# Patient Record
Sex: Female | Born: 1970 | Race: White | Hispanic: No | Marital: Married | Smoking: Never smoker
Health system: Southern US, Community
[De-identification: ages and names within clinical notes are randomized; demographics above are authoritative.]

## PROBLEM LIST (undated history)

## (undated) DIAGNOSIS — F419 Anxiety disorder, unspecified: Secondary | ICD-10-CM

## (undated) DIAGNOSIS — N6320 Unspecified lump in the left breast, unspecified quadrant: Secondary | ICD-10-CM

## (undated) DIAGNOSIS — F32A Depression, unspecified: Secondary | ICD-10-CM

## (undated) DIAGNOSIS — G4730 Sleep apnea, unspecified: Secondary | ICD-10-CM

## (undated) DIAGNOSIS — E785 Hyperlipidemia, unspecified: Secondary | ICD-10-CM

## (undated) HISTORY — PX: LASIK: SHX215

## (undated) HISTORY — DX: Anxiety disorder, unspecified: F41.9

## (undated) HISTORY — PX: COLONOSCOPY: SHX174

---

## 1998-10-11 ENCOUNTER — Other Ambulatory Visit: Admission: RE | Admit: 1998-10-11 | Discharge: 1998-10-11 | Payer: Self-pay | Admitting: Obstetrics and Gynecology

## 1999-04-27 ENCOUNTER — Emergency Department (HOSPITAL_COMMUNITY): Admission: EM | Admit: 1999-04-27 | Discharge: 1999-04-27 | Payer: Self-pay | Admitting: Emergency Medicine

## 1999-10-19 ENCOUNTER — Other Ambulatory Visit: Admission: RE | Admit: 1999-10-19 | Discharge: 1999-10-19 | Payer: Self-pay | Admitting: Obstetrics and Gynecology

## 2000-10-15 ENCOUNTER — Other Ambulatory Visit: Admission: RE | Admit: 2000-10-15 | Discharge: 2000-10-15 | Payer: Self-pay | Admitting: Obstetrics and Gynecology

## 2001-10-19 ENCOUNTER — Other Ambulatory Visit: Admission: RE | Admit: 2001-10-19 | Discharge: 2001-10-19 | Payer: Self-pay | Admitting: Obstetrics and Gynecology

## 2002-11-01 ENCOUNTER — Other Ambulatory Visit: Admission: RE | Admit: 2002-11-01 | Discharge: 2002-11-01 | Payer: Self-pay | Admitting: Obstetrics and Gynecology

## 2002-11-11 ENCOUNTER — Emergency Department (HOSPITAL_COMMUNITY): Admission: EM | Admit: 2002-11-11 | Discharge: 2002-11-12 | Payer: Self-pay | Admitting: Emergency Medicine

## 2002-11-12 ENCOUNTER — Encounter: Payer: Self-pay | Admitting: Emergency Medicine

## 2003-10-18 ENCOUNTER — Other Ambulatory Visit: Admission: RE | Admit: 2003-10-18 | Discharge: 2003-10-18 | Payer: Self-pay | Admitting: Obstetrics and Gynecology

## 2004-02-03 ENCOUNTER — Emergency Department (HOSPITAL_COMMUNITY): Admission: EM | Admit: 2004-02-03 | Discharge: 2004-02-03 | Payer: Self-pay | Admitting: Emergency Medicine

## 2004-10-29 ENCOUNTER — Other Ambulatory Visit: Admission: RE | Admit: 2004-10-29 | Discharge: 2004-10-29 | Payer: Self-pay | Admitting: Obstetrics and Gynecology

## 2004-11-06 ENCOUNTER — Ambulatory Visit (HOSPITAL_COMMUNITY): Admission: RE | Admit: 2004-11-06 | Discharge: 2004-11-06 | Payer: Self-pay | Admitting: Obstetrics and Gynecology

## 2004-11-22 ENCOUNTER — Encounter: Admission: RE | Admit: 2004-11-22 | Discharge: 2004-11-22 | Payer: Self-pay | Admitting: Obstetrics and Gynecology

## 2006-03-25 ENCOUNTER — Other Ambulatory Visit: Admission: RE | Admit: 2006-03-25 | Discharge: 2006-03-25 | Payer: Self-pay | Admitting: Obstetrics and Gynecology

## 2006-04-01 ENCOUNTER — Encounter: Admission: RE | Admit: 2006-04-01 | Discharge: 2006-04-01 | Payer: Self-pay | Admitting: Obstetrics and Gynecology

## 2006-06-04 ENCOUNTER — Ambulatory Visit (HOSPITAL_COMMUNITY): Admission: RE | Admit: 2006-06-04 | Discharge: 2006-06-04 | Payer: Self-pay | Admitting: Chiropractic Medicine

## 2007-01-28 ENCOUNTER — Other Ambulatory Visit: Admission: RE | Admit: 2007-01-28 | Discharge: 2007-01-28 | Payer: Self-pay | Admitting: Obstetrics and Gynecology

## 2007-02-26 ENCOUNTER — Ambulatory Visit (HOSPITAL_COMMUNITY): Admission: RE | Admit: 2007-02-26 | Discharge: 2007-02-26 | Payer: Self-pay | Admitting: Obstetrics and Gynecology

## 2007-06-28 ENCOUNTER — Inpatient Hospital Stay (HOSPITAL_COMMUNITY): Admission: AD | Admit: 2007-06-28 | Discharge: 2007-06-30 | Payer: Self-pay | Admitting: Obstetrics and Gynecology

## 2007-07-07 ENCOUNTER — Ambulatory Visit: Admission: RE | Admit: 2007-07-07 | Discharge: 2007-07-07 | Payer: Self-pay | Admitting: Obstetrics and Gynecology

## 2007-08-10 ENCOUNTER — Other Ambulatory Visit: Admission: RE | Admit: 2007-08-10 | Discharge: 2007-08-10 | Payer: Self-pay | Admitting: Obstetrics and Gynecology

## 2008-01-02 ENCOUNTER — Emergency Department (HOSPITAL_COMMUNITY): Admission: EM | Admit: 2008-01-02 | Discharge: 2008-01-02 | Payer: Self-pay | Admitting: Emergency Medicine

## 2008-07-15 ENCOUNTER — Encounter: Admission: RE | Admit: 2008-07-15 | Discharge: 2008-07-15 | Payer: Self-pay

## 2009-07-29 ENCOUNTER — Observation Stay (HOSPITAL_COMMUNITY): Admission: EM | Admit: 2009-07-29 | Discharge: 2009-07-29 | Payer: Self-pay | Admitting: Emergency Medicine

## 2010-11-03 ENCOUNTER — Encounter: Payer: Self-pay | Admitting: Obstetrics and Gynecology

## 2011-01-17 LAB — URINALYSIS, ROUTINE W REFLEX MICROSCOPIC
Bilirubin Urine: NEGATIVE
Glucose, UA: NEGATIVE mg/dL
Hgb urine dipstick: NEGATIVE
Ketones, ur: >80 mg/dL — AB
Nitrite: NEGATIVE
Protein, ur: NEGATIVE mg/dL
Specific Gravity, Urine: 1.024 (ref 1.005–1.030)
Urobilinogen, UA: 0.2 mg/dL (ref 0.0–1.0)
pH: 7 (ref 5.0–8.0)

## 2011-01-17 LAB — URINE MICROSCOPIC-ADD ON

## 2011-01-17 LAB — POCT PREGNANCY, URINE: Preg Test, Ur: NEGATIVE

## 2011-03-01 NOTE — Discharge Summary (Signed)
[name], [name]          ACCOUNT NO.:  [id number]   MEDICAL RECORD NO.:  [id number]          PATIENT TYPE:  INP   LOCATION:  9144                          FACILITY:  [facility]   PHYSICIAN:  James A. Ashley Royalty, M.D.DATE OF BIRTH:  1971-02-11   DATE OF ADMISSION:  06/28/2007  DATE OF DISCHARGE:  06/30/2007                               DISCHARGE SUMMARY   DISCHARGE DIAGNOSES:  1. Intrauterine pregnancy at 37 weeks 5 days gestation, delivered.  2. Premature rupture of membranes.  3. Advanced maternal age.  4. 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   OPERATIONS AND PROCEDURES:  OB delivery with episiotomy, episiorrhaphy.   CONSULTATIONS:  None.   DISCHARGE MEDICATIONS:  1. Motrin 600 mg.  2. Percocet.   HISTORY AND PHYSICAL:  This is a 40 year old primigravida at 37 weeks 5  days gestation.  Prenatal care complicated by the aforementioned  diagnoses.  The patient presented to maternity admissions complaining of  premature rupture of membranes with contractions ensuing.  Examination  by the RN revealed 2 cm dilatation, 8% effaced, -2 station, vertex  presentation.  There was positive ferning.  There were no new HSV  lesions.   HISTORY AND PHYSICAL:  Please see chart.   HOSPITAL COURSE:  The patient was admitted [facility]  [place].  Admission laboratory studies were drawn.  She was noted be  having contractions approximately every 3 minutes.  She went on to labor  and deliver on June 28, 2007.  The infant was a 5 pound 11 ounce  female, Apgars of 8 at 1 minute, 9 at 5 minutes. Sent to newborn nursery.  Delivery was accomplished by Dr. Sylvester Harder with the vacuum  extractor at a +3 station, LOA, secondary to variable decelerations in  the late second stage.  Delivery was accomplished over second-degree  midline episiotomy with repair without difficulty.  The umbilical cord  avulsed, and placenta was successfully removed  manually.   The patient's postpartum course was benign.  She was discharged on the  second postpartum day afebrile and in satisfactory condition.   DISPOSITION:  The patient is to return to [facility] and  Obstetrics in approximately 6 weeks for postpartum evaluation.      James A. Ashley Royalty, M.D.  Electronically Signed     JAM/MEDQ  D:  08/18/2007  T:  08/19/2007  Job:  [id number]

## 2011-07-25 LAB — CBC
HCT: 33.4 — ABNORMAL LOW
Hemoglobin: 11.6 — ABNORMAL LOW
MCHC: 34.6
MCV: 90.1
Platelets: 150
RBC: 3.71 — ABNORMAL LOW
RDW: 15.8 — ABNORMAL HIGH
WBC: 12.4 — ABNORMAL HIGH

## 2011-07-26 LAB — CBC
HCT: 36.8
Hemoglobin: 12.9
MCHC: 35.1
MCV: 88.8
Platelets: 185
RBC: 4.14
RDW: 15.2 — ABNORMAL HIGH
WBC: 12.2 — ABNORMAL HIGH

## 2011-07-26 LAB — TYPE AND SCREEN
ABO/RH(D): O POS
Antibody Screen: NEGATIVE

## 2011-07-26 LAB — RPR: RPR Ser Ql: NONREACTIVE

## 2011-07-26 LAB — ABO/RH: ABO/RH(D): O POS

## 2011-08-06 ENCOUNTER — Other Ambulatory Visit: Payer: Self-pay | Admitting: Obstetrics and Gynecology

## 2011-08-06 DIAGNOSIS — R928 Other abnormal and inconclusive findings on diagnostic imaging of breast: Secondary | ICD-10-CM

## 2011-08-16 ENCOUNTER — Ambulatory Visit
Admission: RE | Admit: 2011-08-16 | Discharge: 2011-08-16 | Disposition: A | Discharge status: Home / Self Care | Payer: 59 | Source: Ambulatory Visit | Attending: Obstetrics and Gynecology | Admitting: Obstetrics and Gynecology

## 2011-08-16 DIAGNOSIS — R928 Other abnormal and inconclusive findings on diagnostic imaging of breast: Secondary | ICD-10-CM

## 2012-03-11 ENCOUNTER — Other Ambulatory Visit: Payer: Self-pay | Admitting: Obstetrics and Gynecology

## 2012-03-11 DIAGNOSIS — N63 Unspecified lump in unspecified breast: Secondary | ICD-10-CM

## 2012-03-18 ENCOUNTER — Ambulatory Visit
Admission: RE | Admit: 2012-03-18 | Discharge: 2012-03-18 | Disposition: A | Discharge status: Home / Self Care | Payer: BC Managed Care – PPO | Source: Ambulatory Visit | Attending: Obstetrics and Gynecology | Admitting: Obstetrics and Gynecology

## 2012-03-18 DIAGNOSIS — N63 Unspecified lump in unspecified breast: Secondary | ICD-10-CM

## 2012-08-20 ENCOUNTER — Other Ambulatory Visit: Payer: Self-pay | Admitting: Obstetrics and Gynecology

## 2012-08-20 DIAGNOSIS — N63 Unspecified lump in unspecified breast: Secondary | ICD-10-CM

## 2012-09-28 ENCOUNTER — Ambulatory Visit
Admission: RE | Admit: 2012-09-28 | Discharge: 2012-09-28 | Disposition: A | Discharge status: Home / Self Care | Payer: BC Managed Care – PPO | Source: Ambulatory Visit | Attending: Obstetrics and Gynecology | Admitting: Obstetrics and Gynecology

## 2012-09-28 DIAGNOSIS — N63 Unspecified lump in unspecified breast: Secondary | ICD-10-CM

## 2013-08-30 ENCOUNTER — Other Ambulatory Visit: Payer: Self-pay | Admitting: Obstetrics and Gynecology

## 2013-08-30 DIAGNOSIS — N63 Unspecified lump in unspecified breast: Secondary | ICD-10-CM

## 2013-09-30 ENCOUNTER — Ambulatory Visit
Admission: RE | Admit: 2013-09-30 | Discharge: 2013-09-30 | Disposition: A | Discharge status: Home / Self Care | Payer: BC Managed Care – PPO | Source: Ambulatory Visit | Attending: Obstetrics and Gynecology | Admitting: Obstetrics and Gynecology

## 2013-09-30 DIAGNOSIS — N63 Unspecified lump in unspecified breast: Secondary | ICD-10-CM

## 2014-09-13 ENCOUNTER — Other Ambulatory Visit: Payer: Self-pay

## 2014-09-13 DIAGNOSIS — Z1231 Encounter for screening mammogram for malignant neoplasm of breast: Secondary | ICD-10-CM

## 2014-10-03 ENCOUNTER — Ambulatory Visit
Admission: RE | Admit: 2014-10-03 | Discharge: 2014-10-03 | Disposition: A | Discharge status: Home / Self Care | Payer: BC Managed Care – PPO | Source: Ambulatory Visit

## 2014-10-03 DIAGNOSIS — Z1231 Encounter for screening mammogram for malignant neoplasm of breast: Secondary | ICD-10-CM

## 2014-10-05 ENCOUNTER — Other Ambulatory Visit: Payer: Self-pay | Admitting: Obstetrics and Gynecology

## 2014-10-05 DIAGNOSIS — R928 Other abnormal and inconclusive findings on diagnostic imaging of breast: Secondary | ICD-10-CM

## 2014-10-20 ENCOUNTER — Ambulatory Visit
Admission: RE | Admit: 2014-10-20 | Discharge: 2014-10-20 | Disposition: A | Discharge status: Home / Self Care | Payer: BLUE CROSS/BLUE SHIELD | Source: Ambulatory Visit | Attending: Obstetrics and Gynecology | Admitting: Obstetrics and Gynecology

## 2014-10-20 DIAGNOSIS — R928 Other abnormal and inconclusive findings on diagnostic imaging of breast: Secondary | ICD-10-CM

## 2015-09-27 ENCOUNTER — Other Ambulatory Visit: Payer: Self-pay

## 2015-09-27 DIAGNOSIS — Z1231 Encounter for screening mammogram for malignant neoplasm of breast: Secondary | ICD-10-CM

## 2015-10-18 ENCOUNTER — Ambulatory Visit
Admission: RE | Admit: 2015-10-18 | Discharge: 2015-10-18 | Disposition: A | Discharge status: Home / Self Care | Payer: BLUE CROSS/BLUE SHIELD | Source: Ambulatory Visit

## 2015-10-18 DIAGNOSIS — Z1231 Encounter for screening mammogram for malignant neoplasm of breast: Secondary | ICD-10-CM

## 2016-09-19 ENCOUNTER — Other Ambulatory Visit: Payer: Self-pay | Admitting: Obstetrics & Gynecology

## 2016-09-19 DIAGNOSIS — Z1231 Encounter for screening mammogram for malignant neoplasm of breast: Secondary | ICD-10-CM

## 2016-10-18 ENCOUNTER — Ambulatory Visit: Payer: BLUE CROSS/BLUE SHIELD

## 2016-11-04 ENCOUNTER — Ambulatory Visit: Payer: BLUE CROSS/BLUE SHIELD

## 2016-11-14 ENCOUNTER — Ambulatory Visit
Admission: RE | Admit: 2016-11-14 | Discharge: 2016-11-14 | Disposition: A | Discharge status: Home / Self Care | Payer: 59 | Source: Ambulatory Visit | Attending: Obstetrics & Gynecology | Admitting: Obstetrics & Gynecology

## 2016-11-14 DIAGNOSIS — Z1231 Encounter for screening mammogram for malignant neoplasm of breast: Secondary | ICD-10-CM

## 2017-09-11 ENCOUNTER — Other Ambulatory Visit: Payer: Self-pay | Admitting: Obstetrics & Gynecology

## 2017-09-11 DIAGNOSIS — Z1231 Encounter for screening mammogram for malignant neoplasm of breast: Secondary | ICD-10-CM

## 2017-11-17 ENCOUNTER — Ambulatory Visit
Admission: RE | Admit: 2017-11-17 | Discharge: 2017-11-17 | Disposition: A | Discharge status: Home / Self Care | Payer: 59 | Source: Ambulatory Visit | Attending: Obstetrics & Gynecology | Admitting: Obstetrics & Gynecology

## 2017-11-17 DIAGNOSIS — Z1231 Encounter for screening mammogram for malignant neoplasm of breast: Secondary | ICD-10-CM

## 2018-10-21 ENCOUNTER — Other Ambulatory Visit: Payer: Self-pay | Admitting: Obstetrics and Gynecology

## 2018-10-21 DIAGNOSIS — Z1231 Encounter for screening mammogram for malignant neoplasm of breast: Secondary | ICD-10-CM

## 2018-10-26 DIAGNOSIS — H43812 Vitreous degeneration, left eye: Secondary | ICD-10-CM | POA: Diagnosis not present

## 2018-11-16 DIAGNOSIS — H43812 Vitreous degeneration, left eye: Secondary | ICD-10-CM | POA: Diagnosis not present

## 2018-11-18 ENCOUNTER — Ambulatory Visit
Admission: RE | Admit: 2018-11-18 | Discharge: 2018-11-18 | Disposition: A | Discharge status: Home / Self Care | Payer: BLUE CROSS/BLUE SHIELD | Source: Ambulatory Visit | Attending: Obstetrics and Gynecology | Admitting: Obstetrics and Gynecology

## 2018-11-18 DIAGNOSIS — Z1231 Encounter for screening mammogram for malignant neoplasm of breast: Secondary | ICD-10-CM | POA: Diagnosis not present

## 2018-11-19 DIAGNOSIS — Z124 Encounter for screening for malignant neoplasm of cervix: Secondary | ICD-10-CM | POA: Diagnosis not present

## 2018-11-19 DIAGNOSIS — Z01419 Encounter for gynecological examination (general) (routine) without abnormal findings: Secondary | ICD-10-CM | POA: Diagnosis not present

## 2018-11-19 DIAGNOSIS — Z6841 Body Mass Index (BMI) 40.0 and over, adult: Secondary | ICD-10-CM | POA: Diagnosis not present

## 2018-12-16 DIAGNOSIS — F329 Major depressive disorder, single episode, unspecified: Secondary | ICD-10-CM | POA: Diagnosis not present

## 2018-12-16 DIAGNOSIS — F419 Anxiety disorder, unspecified: Secondary | ICD-10-CM | POA: Diagnosis not present

## 2018-12-16 DIAGNOSIS — M545 Low back pain: Secondary | ICD-10-CM | POA: Diagnosis not present

## 2018-12-17 DIAGNOSIS — Z0000 Encounter for general adult medical examination without abnormal findings: Secondary | ICD-10-CM | POA: Diagnosis not present

## 2019-01-04 DIAGNOSIS — J0100 Acute maxillary sinusitis, unspecified: Secondary | ICD-10-CM | POA: Diagnosis not present

## 2019-01-04 DIAGNOSIS — F329 Major depressive disorder, single episode, unspecified: Secondary | ICD-10-CM | POA: Diagnosis not present

## 2019-03-01 DIAGNOSIS — F419 Anxiety disorder, unspecified: Secondary | ICD-10-CM | POA: Diagnosis not present

## 2019-03-01 DIAGNOSIS — M7138 Other bursal cyst, other site: Secondary | ICD-10-CM | POA: Diagnosis not present

## 2019-03-03 DIAGNOSIS — M71342 Other bursal cyst, left hand: Secondary | ICD-10-CM | POA: Diagnosis not present

## 2019-03-03 DIAGNOSIS — M71341 Other bursal cyst, right hand: Secondary | ICD-10-CM | POA: Diagnosis not present

## 2019-03-03 DIAGNOSIS — D2262 Melanocytic nevi of left upper limb, including shoulder: Secondary | ICD-10-CM | POA: Diagnosis not present

## 2019-03-03 DIAGNOSIS — D2261 Melanocytic nevi of right upper limb, including shoulder: Secondary | ICD-10-CM | POA: Diagnosis not present

## 2019-03-19 DIAGNOSIS — M79672 Pain in left foot: Secondary | ICD-10-CM | POA: Diagnosis not present

## 2019-03-19 DIAGNOSIS — M67472 Ganglion, left ankle and foot: Secondary | ICD-10-CM | POA: Diagnosis not present

## 2019-03-27 DIAGNOSIS — R21 Rash and other nonspecific skin eruption: Secondary | ICD-10-CM | POA: Diagnosis not present

## 2019-04-13 DIAGNOSIS — M67472 Ganglion, left ankle and foot: Secondary | ICD-10-CM | POA: Diagnosis not present

## 2019-04-13 DIAGNOSIS — M71372 Other bursal cyst, left ankle and foot: Secondary | ICD-10-CM | POA: Diagnosis not present

## 2019-04-13 HISTORY — PX: OTHER SURGICAL HISTORY: SHX169

## 2019-05-04 DIAGNOSIS — Z6841 Body Mass Index (BMI) 40.0 and over, adult: Secondary | ICD-10-CM | POA: Diagnosis not present

## 2019-05-04 DIAGNOSIS — R0683 Snoring: Secondary | ICD-10-CM | POA: Diagnosis not present

## 2019-05-04 DIAGNOSIS — F329 Major depressive disorder, single episode, unspecified: Secondary | ICD-10-CM | POA: Diagnosis not present

## 2019-05-04 DIAGNOSIS — E785 Hyperlipidemia, unspecified: Secondary | ICD-10-CM | POA: Diagnosis not present

## 2019-05-13 ENCOUNTER — Encounter: Payer: Self-pay | Admitting: Neurology

## 2019-05-13 ENCOUNTER — Other Ambulatory Visit: Payer: Self-pay

## 2019-05-13 ENCOUNTER — Ambulatory Visit (INDEPENDENT_AMBULATORY_CARE_PROVIDER_SITE_OTHER): Payer: BC Managed Care – PPO | Admitting: Neurology

## 2019-05-13 VITALS — BP 115/75 | HR 85 | Temp 97.8°F | Ht 64.0 in | Wt 278.0 lb

## 2019-05-13 DIAGNOSIS — G478 Other sleep disorders: Secondary | ICD-10-CM | POA: Diagnosis not present

## 2019-05-13 DIAGNOSIS — G4719 Other hypersomnia: Secondary | ICD-10-CM | POA: Diagnosis not present

## 2019-05-13 DIAGNOSIS — R0683 Snoring: Secondary | ICD-10-CM

## 2019-05-13 NOTE — Patient Instructions (Signed)

## 2019-05-13 NOTE — Progress Notes (Addendum)
SLEEP MEDICINE CLINIC    Provider:  Larey Seat, MD  Primary Care Physician:  [name], [place] [address]     Referring Provider: [name], [place],  [place] [address]          Chief Complaint according to patient   Patient presents with:    . New Patient (Initial Visit)           HISTORY OF PRESENT ILLNESS:  Tonya Knight is a 48 y.o. year old right handed Caucasian female patient seen here on  05/13/2019 in referral from [name], MD .  Chief concern according to patient : " My husband is concerned about my snoring, and apneas'.    She has a  has a past medical history of Anxiety. She is considered obese, too.  She gained weight after her foot surgery, is impaired and limited in ambulation. 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 a high risk for OSA. The patient never had a sleep study .    Sleep relevant medical history: Nocturia 1-2 , occasional night terror, or night mares. Yelling in her sleep.  Tonsillectomy: none , cervical spine surgery/ ENT; none.  She has  L 3-4-5 partial vertebral fracture ( the bone processes)  Foot surgery on the left June 30 s. .    Family medical Rachelle Hora history: No  other family member on CPAP with OSA, insomnia, sleep walking. She was an only child and her parents sleep well.     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 Family status is married , with 97 year old son. The patient currently works from home- Pets are present. 1 cat.  Tobacco use; none .  ETOH use: 1-2 month, Caffeine intake in form of Coffee( 2 cups ) Soda( 1/ week) Tea ( none) nor energy drinks. Regular exercise; none.    Sleep habits are as follows: The patient's dinner time is between 5.30-6 PM. Spends evening at home.  The patient goes to bed at 9 PM and goes to sleep in less than 30 minutes. She continues to  sleep for 2-3hours, wakes for 2 bathroom breaks, the first time at 1-2 AM.  No TV and no clock in the bedroom.  The preferred sleep position is lateral, with the support of 1 big body pillows.  Dreams are reportedly frequent, some are vivid. The bedroom is cool, quiet and dark.  5.50  AM is the usual rise time. The patient wakes up spontaneously between 6-7 if without an alarm.  She reports not feeling refreshed or restored in AM, with residual fatigue.  Naps are taken in early afternoon -frequently, lasting up to 60 minutes and are more refreshing than nocturnal sleep.    Review of Systems: Out of a complete 14 system review, the patient complains of only the following symptoms, and all other reviewed systems are negative.:  Fatigue, sleepiness , snoring, fragmented sleep,  How likely are you to doze in the following situations: 0 = not likely, 1 = slight chance, 2 = moderate chance, 3 = high chance   Sitting and Reading? Watching Television? Sitting inactive in a public place (theater or meeting)? As a passenger in a car for an hour without a break? Lying down in the afternoon when circumstances permit? Sitting and talking to someone? Sitting quietly  after lunch without alcohol? In a car, while stopped for a few minutes in traffic?   Total = 8/ 24 points   FSS endorsed at 35/ 63 points.   Social History   Socioeconomic History  . Marital status: Married    Spouse name: Not on file  . Number of children: Not on file  . Years of education: Not on file  . Highest education level: Not on file  Occupational History  . Not on file  Social Needs  . Financial resource strain: Not on file  . Food insecurity    Worry: Not on file    Inability: Not on file  . Transportation needs    Medical: Not on file    Non-medical: Not on file  Tobacco Use  . Smoking status: Never Smoker  . Smokeless tobacco: Never Used  Substance and Sexual Activity  . Alcohol use: Yes  . Drug use: Not  Currently  . Sexual activity: Not on file  Lifestyle  . Physical activity  NONE     Days per week:     Minutes per session:   . Stress:   Relationships  . Social Herbalist on phone:     Gets together:     Attends religious service:     Active member of club or organization:     Attends meetings of clubs or organizations: Not on file    Relationship status: Married,   Other Topics Concern  . Not on file  Social History Narrative  . Not on file    Family History  Problem Relation Age of Onset  . Hyperlipidemia Mother   . Hyperlipidemia Father   . Breast cancer Paternal Aunt   . Heart attack Maternal Grandfather   . Cancer Paternal Grandfather     Past Medical History:  Diagnosis Date  . Anxiety     Current Outpatient Medications on File Prior to Visit  Medication Sig Dispense Refill  . ARIPiprazole (ABILIFY) 2 MG tablet Take 2 mg by mouth daily.    . Cholecalciferol (VITAMIN D) 50 MCG (2000 UT) tablet Take 2,000 Units by mouth daily.     . clonazePAM (KLONOPIN) 0.5 MG tablet Take 2 mg by mouth daily.     . cyclobenzaprine (FLEXERIL) 10 MG tablet Take 10 mg by mouth 3 (three) times daily as needed for muscle spasms.    Marland Kitchen escitalopram (LEXAPRO) 20 MG tablet Take 20 mg by mouth daily.    . meloxicam (MOBIC) 15 MG tablet Take 15 mg by mouth daily.    . simvastatin (ZOCOR) 10 MG tablet Take 10 mg by mouth daily.     No current facility-administered medications on file prior to visit.     No Known Allergies  Physical exam:  Today's Vitals   05/13/19 0841  BP: 115/75  Pulse: 85  Temp: 97.8 F (36.6 C)  Weight: 278 lb (126.1 kg)  Height: 5\' 4"  (1.626 m)   Body mass index is 47.72 kg/m.   Wt Readings from Last 3 Encounters:  05/13/19 278 lb (126.1 kg)     Ht Readings from Last 3 Encounters:  05/13/19 5\' 4"  (1.626 m)      General: The patient is awake, alert and appears not in acute distress. The patient is well groomed. Head: Normocephalic,  atraumatic. Neck is supple. Mallampati 4,  neck circumference:18 inches . Nasal airflow patent.  Retrognathia not seen.  Dental status: intact Cardiovascular:  Regular rate and cardiac  rhythm by pulse,  without distended neck veins. Respiratory: Lungs are clear to auscultation.  Skin:  Without evidence of ankle edema, or rash. Trunk: The patient's posture is erect.   Neurologic exam : The patient is awake and alert, oriented to place and time.   Memory subjective described as intact.  Attention span & concentration ability appears normal.  Speech is fluent,  without  dysarthria, dysphonia or aphasia.  Mood and affect are appropriate.   Cranial nerves: no loss of smell or taste reported  Pupils are equal and briskly reactive to light. Funduscopic exam deferred. .  Extraocular movements in vertical and horizontal planes were intact and without nystagmus. No Diplopia. Visual fields by finger perimetry are intact. Hearing was intact to soft voice and finger rubbing.  Tinnitus- left ear.   Facial sensation intact to fine touch.  Facial motor strength is symmetric and tongue and uvula move midline.  Neck ROM : rotation, tilt and flexion extension were normal for age and shoulder shrug was symmetrical.    Motor exam:  Symmetric bulk, tone and ROM.   Normal tone without cog wheeling,  grip strength stronger on the left than right, known for 11 years- .    Sensory:  Fine touch, pinprick and vibration were tested  and  normal.  Proprioception tested in the upper extremities was normal.   Coordination: Rapid alternating movements in the fingers/hands were of normal speed.  The Finger-to-nose maneuver was intact without evidence of ataxia, dysmetria or tremor.   Gait and station: Patient could rise unassisted from a seated position, walked without assistive device.  Stance is of normal width/ base and the patient turned with 3 steps.  Toe and heel walk were deferred.  Deep tendon reflexes: in  the upper and lower extremities are symmetric and intact.  Babinski response was deferred.       After spending a total time of  30  minutes face to face and additional time for physical and neurologic examination, review of laboratory studies,  personal review of imaging studies, reports and results of other testing and review of referral information / records as far as provided in visit, I have established the following assessments:  1) I like for this patient to undergo a HST - there is not need for  PSG attended with apnea screening and nightmares.     My Plan is to proceed with: HST.   I would like to thank [name], MD [place],  [place] [address] for allowing me to meet with and to take care of this pleasant patient.    Electronically signed by: Larey Seat, MD 05/13/2019 9:13 AM  [facility] Sleep Board certified by The [employer] of Sleep Medicine and Diplomate of the [employer] of Sleep Medicine. Board certified In Neurology through the [place], Fellow of the [employer] of Neurology. Medical Director of [employer].

## 2019-06-02 ENCOUNTER — Ambulatory Visit: Payer: BC Managed Care – PPO | Admitting: Neurology

## 2019-06-14 ENCOUNTER — Ambulatory Visit: Payer: BC Managed Care – PPO | Admitting: Neurology

## 2019-06-14 DIAGNOSIS — R0683 Snoring: Secondary | ICD-10-CM

## 2019-06-14 DIAGNOSIS — G478 Other sleep disorders: Secondary | ICD-10-CM | POA: Diagnosis not present

## 2019-06-14 DIAGNOSIS — G4719 Other hypersomnia: Secondary | ICD-10-CM | POA: Diagnosis not present

## 2019-06-15 DIAGNOSIS — F329 Major depressive disorder, single episode, unspecified: Secondary | ICD-10-CM | POA: Diagnosis not present

## 2019-06-15 DIAGNOSIS — Z79899 Other long term (current) drug therapy: Secondary | ICD-10-CM | POA: Diagnosis not present

## 2019-06-15 DIAGNOSIS — E785 Hyperlipidemia, unspecified: Secondary | ICD-10-CM | POA: Diagnosis not present

## 2019-06-15 DIAGNOSIS — Z0000 Encounter for general adult medical examination without abnormal findings: Secondary | ICD-10-CM | POA: Diagnosis not present

## 2019-06-23 NOTE — Procedures (Signed)
  Patient Information     First Name: [name] Last Name: [place] ID: [id number]  Birth Date: Feb 17, 1971 Age: 47 Gender: Female  Referring Provider: [name], MD BMI: 47.4 (W=278 lb, H=5' 4'')  Neck Circ.:  18 '' Epworth:  8/24   Sleep Study Information    Study Date: Jun 14, 2019 S/H/A Version: 001.001.001.001 / 4.0.1515 / 74  History:     Tonya Knight is a 48 y.o. year old right handed Caucasian female patient seen here on 05/13/2019 upon referral from [name], MD.  Chief concern according to patient: "My husband is concerned about my snoring and apneas'. Hx: She has a medical history of Anxiety, is considered obese, too.  She gained weight after her foot surgery, while impaired and limited in ambulation. She reached morbid obesity at BMI of 47 kg./m2. A high risk for OSA. The patient never had a sleep study.             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 This will require immediate treatment. In addition :   complicating are 123456 central apnea and 2)prolonged hypoxemia, independent of sleep position.   Recommendations:     I would much prefer an attended sleep study to make sure that oxygen can be given if needed. Immediate treatment with CPAP however is required and I will order an autotitration CPAP device 6-16 cm water, 1 cm EPR, heated humidity and mask of choice.  Electronically Signed: Larey Seat, MD   06-23-2019            Sleep Summary  Oxygen Saturation Statistics   Start Study Time: End Study Time: Total Recording Time:  8:04:36 PM 5:40:55 AM    9 h, 36 min  Total Sleep Time % REM of Sleep Time:  9 h, 4 min  14.0    Mean: 92 Minimum: 76 Maximum: 100  Mean of Desaturations Nadirs (%):   89  Oxygen Desaturation. %:   4-9 10-20 >20 Total  Events Number Total   588  81 87.5 12.1  3 0.4  672 100.0  Oxygen Saturation: <90 <=88 <85 <80 <70  Duration (minutes): Sleep % 59.6 10.9 34.6 5.8 6.3 1.1 0.8 0.1 0.0  0.0     Respiratory Indices      Total Events REM NREM All Night  pRDI:  765  pAHI:  765 ODI:  672  pAHIc:  81  % CSR: 12.1 88.5 88.5 91.7 8.8 84.6 84.6 72.0 10.1 85.1 85.1 74.8 9.9       Pulse Rate Statistics during Sleep (BPM)      Mean: 84 Minimum: 50 Maximum: 112    Indices are calculated using technically valid sleep time of  8 hrs, 59 min. Central-Indices are calculated using technically valid sleep time of  8  hrs, 12 min. pRDI/pAHI are calculated using oxi desaturations ? 3%  Body Position Statistics  Position Supine Prone Right Left Non-Supine  Sleep (min) 285.0 0.0 24.8 234.5 259.3  Sleep % 52.4 0.0 4.6 43.1 47.6  pRDI 76.7 N/A 105.2 93.3 94.4  pAHI 76.7 N/A 105.2 93.3 94.4  ODI 63.7 N/A 90.5 86.6 86.9     Snoring Statistics Snoring Level (dB) >40 >50 >60 >70 >80 >Threshold (45)  Sleep (min) 482.9 233.6 20.5 0.0 0.0 369.5  Sleep % 88.7 42.9 3.8 0.0 0.0 67.9    Mean: 49 dB

## 2019-06-24 ENCOUNTER — Other Ambulatory Visit: Payer: Self-pay | Admitting: Neurology

## 2019-06-24 ENCOUNTER — Telehealth: Payer: Self-pay | Admitting: Neurology

## 2019-06-24 NOTE — Telephone Encounter (Signed)
-----   Message from Larey Seat, MD sent at 06/23/2019 12:41 PM EDT ----- Summary & Diagnosis:   Severest sleep apnea at AHI of 85.1/h. This will require  immediate treatment. In addition :  complicating are 123456  central apnea and 2)prolonged hypoxemia, independent of sleep  position.   Recommendations:    I would much prefer an attended sleep study to make sure that  oxygen can be given if needed. Immediate treatment with CPAP  however is required and I will order an autotitration CPAP device  6-16 cm water, 1 cm EPR, heated humidity and mask of choice.  Electronically Signed: Larey Seat, MD  06-23-2019

## 2019-06-24 NOTE — Telephone Encounter (Signed)
I called pt. I advised pt that Dr. Brett Fairy reviewed their sleep study results and found that pt has severe sleep apnea. Dr. Brett Fairy recommends that pt start auto CPAP. I reviewed PAP compliance expectations with the pt. Pt is agreeable to starting a CPAP. I advised pt that an order will be sent to a DME, [facility], and [facility] will call the pt within about one week after they file with the pt's insurance. [facility] will show the pt how to use the machine, fit for masks, and troubleshoot the CPAP if needed. A follow up appt was made for insurance purposes with Debbora Presto, NP on Nov 16,2020 at 3 pm. Pt verbalized understanding to arrive 15 minutes early and bring their CPAP. A letter with all of this information in it will be mailed to the pt as a reminder. I verified with the pt that the address we have on file is correct. Pt verbalized understanding of results. Pt had no questions at this time but was encouraged to call back if questions arise. I have sent the order to [facility] and have received confirmation that they have received the order.

## 2019-06-28 DIAGNOSIS — Z0000 Encounter for general adult medical examination without abnormal findings: Secondary | ICD-10-CM | POA: Diagnosis not present

## 2019-06-28 DIAGNOSIS — M545 Low back pain: Secondary | ICD-10-CM | POA: Diagnosis not present

## 2019-06-28 DIAGNOSIS — F329 Major depressive disorder, single episode, unspecified: Secondary | ICD-10-CM | POA: Diagnosis not present

## 2019-06-28 DIAGNOSIS — E785 Hyperlipidemia, unspecified: Secondary | ICD-10-CM | POA: Diagnosis not present

## 2019-06-28 DIAGNOSIS — Z23 Encounter for immunization: Secondary | ICD-10-CM | POA: Diagnosis not present

## 2019-07-07 DIAGNOSIS — G4733 Obstructive sleep apnea (adult) (pediatric): Secondary | ICD-10-CM | POA: Diagnosis not present

## 2019-08-04 DIAGNOSIS — F419 Anxiety disorder, unspecified: Secondary | ICD-10-CM | POA: Diagnosis not present

## 2019-08-04 DIAGNOSIS — F329 Major depressive disorder, single episode, unspecified: Secondary | ICD-10-CM | POA: Diagnosis not present

## 2019-08-06 DIAGNOSIS — G4733 Obstructive sleep apnea (adult) (pediatric): Secondary | ICD-10-CM | POA: Diagnosis not present

## 2019-08-09 DIAGNOSIS — Z20828 Contact with and (suspected) exposure to other viral communicable diseases: Secondary | ICD-10-CM | POA: Diagnosis not present

## 2019-08-25 DIAGNOSIS — G5712 Meralgia paresthetica, left lower limb: Secondary | ICD-10-CM | POA: Diagnosis not present

## 2019-08-25 DIAGNOSIS — M545 Low back pain: Secondary | ICD-10-CM | POA: Diagnosis not present

## 2019-08-29 DIAGNOSIS — G4733 Obstructive sleep apnea (adult) (pediatric): Secondary | ICD-10-CM | POA: Insufficient documentation

## 2019-08-29 NOTE — Patient Instructions (Addendum)
Continue CPAP nightly and for greater than 4 hours each night   Follow up in 6 months, sooner if needed   Sleep Apnea Sleep apnea affects breathing during sleep. It causes breathing to stop for a short time or to become shallow. It can also increase the risk of:  Heart attack.  Stroke.  Being very overweight (obese).  Diabetes.  Heart failure.  Irregular heartbeat. The goal of treatment is to help you breathe normally again. What are the causes? There are three kinds of sleep apnea:  Obstructive sleep apnea. This is caused by a blocked or collapsed airway.  Central sleep apnea. This happens when the brain does not send the right signals to the muscles that control breathing.  Mixed sleep apnea. This is a combination of obstructive and central sleep apnea. The most common cause of this condition is a collapsed or blocked airway. This can happen if:  Your throat muscles are too relaxed.  Your tongue and tonsils are too large.  You are overweight.  Your airway is too small. What increases the risk?  Being overweight.  Smoking.  Having a small airway.  Being older.  Being female.  Drinking alcohol.  Taking medicines to calm yourself (sedatives or tranquilizers).  Having family members with the condition. What are the signs or symptoms?  Trouble staying asleep.  Being sleepy or tired during the day.  Getting angry a lot.  Loud snoring.  Headaches in the morning.  Not being able to focus your mind (concentrate).  Forgetting things.  Less interest in sex.  Mood swings.  Personality changes.  Feelings of sadness (depression).  Waking up a lot during the night to pee (urinate).  Dry mouth.  Sore throat. How is this diagnosed?  Your medical history.  A physical exam.  A test that is done when you are sleeping (sleep study). The test is most often done in a sleep lab but may also be done at home. How is this treated?   Sleeping on your  side.  Using a medicine to get rid of mucus in your nose (decongestant).  Avoiding the use of alcohol, medicines to help you relax, or certain pain medicines (narcotics).  Losing weight, if needed.  Changing your diet.  Not smoking.  Using a machine to open your airway while you sleep, such as: ? An oral appliance. This is a mouthpiece that shifts your lower jaw forward. ? A CPAP device. This device blows air through a mask when you breathe out (exhale). ? An EPAP device. This has valves that you put in each nostril. ? A BPAP device. This device blows air through a mask when you breathe in (inhale) and breathe out.  Having surgery if other treatments do not work. It is important to get treatment for sleep apnea. Without treatment, it can lead to:  High blood pressure.  Coronary artery disease.  In men, not being able to have an erection (impotence).  Reduced thinking ability. Follow these instructions at home: Lifestyle  Make changes that your doctor recommends.  Eat a healthy diet.  Lose weight if needed.  Avoid alcohol, medicines to help you relax, and some pain medicines.  Do not use any products that contain nicotine or tobacco, such as cigarettes, e-cigarettes, and chewing tobacco. If you need help quitting, ask your doctor. General instructions  Take over-the-counter and prescription medicines only as told by your doctor.  If you were given a machine to use while you sleep, use it   only as told by your doctor.  If you are having surgery, make sure to tell your doctor you have sleep apnea. You may need to bring your device with you.  Keep all follow-up visits as told by your doctor. This is important. Contact a doctor if:  The machine that you were given to use during sleep bothers you or does not seem to be working.  You do not get better.  You get worse. Get help right away if:  Your chest hurts.  You have trouble breathing in enough air.  You have  an uncomfortable feeling in your back, arms, or stomach.  You have trouble talking.  One side of your body feels weak.  A part of your face is hanging down. These symptoms may be an emergency. Do not wait to see if the symptoms will go away. Get medical help right away. Call your local emergency services (911 in the [place] Do not drive yourself to the hospital. Summary  This condition affects breathing during sleep.  The most common cause is a collapsed or blocked airway.  The goal of treatment is to help you breathe normally while you sleep. This information is not intended to replace advice given to you by your health care provider. Make sure you discuss any questions you have with your health care provider. Document Released: 07/09/2008 Document Revised: 07/17/2018 Document Reviewed: 05/26/2018 Elsevier Patient Education  2020 [employer]  

## 2019-08-29 NOTE — Progress Notes (Signed)
PATIENT: Tonya Knight DOB: [birth year]  REASON FOR VISIT: follow up HISTORY FROM: patient  Chief Complaint  Patient presents with  . Follow-up    Initial cpap f/u. Alone. Rm 8. No new concerns at this time.      HISTORY OF PRESENT ILLNESS: Today 08/30/19 Tonya Knight is a 47 y.o. female here today for follow up for OSA on CPAP. She was diagnosed with severe sleep apnea with prolonged hypoxemia in 04/2019. Auto pap started immediately. Dr Dohmeier has ordered a titration study. She wishes to hold off on this study if possible. She is doing well with CPAP. She does feel less tired and less irritable in the mornings.    Compliance report dated 07/27/2019 through 08/25/2019 reveals that she is used CPAP 29 of the last 30 days for compliance of 97%.  25 days she used CPAP greater than 4 hours for compliance of 83%.  Average usage is 6 hours and 56 minutes AHI 2.2 on 6 to 16 cm of water and an EPR of 1.  There was no significant leak noted.   HISTORY: (copied from Dr Dohmeier's note on 05/13/2019)  Tonya Knight a 48 y.o. year old right handed Caucasian female patientseen here on  05/13/2019 in referral from [name], MD . Chiefconcernaccording to patient : "My husband is concerned about my snoring, and apneas'.  She has a  has a past medical history of Anxiety. She is considered obese, too.  She gained weight after her foot surgery, is impaired and limited in ambulation. 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 a high risk for OSA. The patient never had a sleep study .   Sleeprelevant medical history: Nocturia 1-2 , occasional night terror, or night mares. Yelling in her sleep.  Tonsillectomy: none , cervical spine surgery/ ENT; none.  She has  L 3-4-5 partial vertebral fracture ( the bone processes)  Foot surgery on the left June 30 s. [name] history:No  other family member on CPAP with OSA, insomnia, sleep  walking. She was an only child and her parents sleep well.   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 Family status is married , with 70 year old son. The patient currently works from home- Pets are present. 1 cat.  Tobacco use; none . ETOH use: 1-2 month, Caffeine intake in form of Coffee( 2 cups ) Soda( 1/ week) Tea ( none) nor energy drinks. Regular exercise; none.   Sleep habits are as follows:The patient's dinner time is between 5.30-6 PM. Spends evening at home.  The patient goes to bed at 9 PM and goes to sleep in less than 30 minutes. She continues to sleep for 2-3hours, wakes for 2 bathroom breaks, the first time at 1-2 AM.  No TV and no clock in the bedroom.  The preferred sleep position is lateral, with the support of 1 big body pillows.  Dreams are reportedly frequent, some are vivid. The bedroom is cool, quiet and dark.  5.50  AM is the usual rise time. The patient wakes up spontaneously between 6-7 if without an alarm.  She reports not feeling refreshed or restored in AM, with residual fatigue.  Naps are taken in early afternoon -frequently, lasting up to 60 minutes and are more refreshing than nocturnal sleep.    REVIEW OF SYSTEMS: Out of a complete 14 system review of symptoms, the  patient complains only of the following symptoms, snoring, acting out dreams and all other reviewed systems are negative.   ALLERGIES: No Known Allergies  HOME MEDICATIONS: Outpatient Medications Prior to Visit  Medication Sig Dispense Refill  . ARIPiprazole (ABILIFY) 5 MG tablet Take 5 mg by mouth daily.    . B Complex Vitamins (VITAMIN B COMPLEX PO) vitamin B complex    . Cholecalciferol (VITAMIN D) 50 MCG (2000 UT) tablet Take 2,000 Units by mouth daily.     . clonazePAM (KLONOPIN) 0.5 MG tablet Take 2 mg by mouth daily.     . cyclobenzaprine (FLEXERIL) 10 MG tablet Take 10 mg by mouth as needed for muscle spasms.      Marland Kitchen escitalopram (LEXAPRO) 20 MG tablet Take 20 mg by mouth daily.    Marland Kitchen HYDROcodone-acetaminophen (NORCO/VICODIN) 5-325 MG tablet Take 1 tablet by mouth as needed.     . Melatonin 5 MG CAPS Take 1 capsule by mouth at bedtime as needed.    . meloxicam (MOBIC) 15 MG tablet Take 15 mg by mouth daily.    . simvastatin (ZOCOR) 10 MG tablet Take 10 mg by mouth daily.    . ARIPiprazole (ABILIFY) 2 MG tablet Take 2 mg by mouth daily.     No facility-administered medications prior to visit.     PAST MEDICAL HISTORY: Past Medical History:  Diagnosis Date  . Anxiety     PAST SURGICAL HISTORY: Past Surgical History:  Procedure Laterality Date  . LASIK    . left foot surgery Left 04/13/2019   patient reported    FAMILY HISTORY: Family History  Problem Relation Age of Onset  . Hyperlipidemia Mother   . Hyperlipidemia Father   . Breast cancer Paternal Aunt   . Heart attack Maternal Grandfather   . Cancer Paternal Grandfather     SOCIAL HISTORY: Social History   Socioeconomic History  . Marital status: Married    Spouse name: Not on file  . Number of children: Not on file  . Years of education: Not on file  . Highest education level: Not on file  Occupational History  . Not on file  Social Needs  . Financial resource strain: Not on file  . Food insecurity    Worry: Not on file    Inability: Not on file  . Transportation needs    Medical: Not on file    Non-medical: Not on file  Tobacco Use  . Smoking status: Never Smoker  . Smokeless tobacco: Never Used  Substance and Sexual Activity  . Alcohol use: Yes  . Drug use: Not Currently  . Sexual activity: Not on file  Lifestyle  . Physical activity    Days per week: Not on file    Minutes per session: Not on file  . Stress: Not on file  Relationships  . Social Herbalist on phone: Not on file    Gets together: Not on file    Attends religious service: Not on file    Active member of club or organization:  Not on file    Attends meetings of clubs or organizations: Not on file    Relationship status: Not on file  . Intimate partner violence    Fear of current or ex partner: Not on file    Emotionally abused: Not on file    Physically abused: Not on file    Forced sexual activity: Not on file  Other Topics Concern  . Not on file  Social History Narrative  . Not on file      PHYSICAL EXAM  Vitals:   08/30/19 1459  BP: 123/75  Pulse: 87  Temp: 97.9 F (36.6 C)  TempSrc: Oral  Weight: 278 lb 12.8 oz (126.5 kg)  Height: 5\' 4"  (1.626 m)   Body mass index is 47.86 kg/m.  Generalized: Well developed, in no acute distress  Cardiology: normal rate and rhythm, no murmur noted Respiratory: clear to auscultation bilaterally  Neurological examination  Mentation: Alert oriented to time, place, history taking. Follows all commands speech and language fluent Cranial nerve II-XII: Pupils were equal round reactive to light. Extraocular movements were full, visual field were full on confrontational test.  Motor: The motor testing reveals 5 over 5 strength of all 4 extremities. Good symmetric motor tone is noted throughout.  Gait and station: Gait is normal.   DIAGNOSTIC DATA (LABS, IMAGING, TESTING) - I reviewed patient records, labs, notes, testing and imaging myself where available.  No flowsheet data found.   Lab Results  Component Value Date   WBC 12.4 (H) 06/29/2007   HGB 11.6 (L) 06/29/2007   HCT 33.4 (L) 06/29/2007   MCV 90.1 06/29/2007   PLT 150 06/29/2007   No results found for: NA, K, CL, CO2, GLUCOSE, BUN, CREATININE, CALCIUM, PROT, ALBUMIN, AST, ALT, ALKPHOS, BILITOT, GFRNONAA, GFRAA No results found for: CHOL, HDL, LDLCALC, LDLDIRECT, TRIG, CHOLHDL No results found for: HGBA1C No results found for: VITAMINB12 No results found for: TSH     ASSESSMENT AND PLAN 48 y.o. year old female  has a past medical history of Anxiety. here with     ICD-10-CM   1. OSA on CPAP   G47.33    Z99.89     Continues doing very well on CPAP therapy.  She has noted feeling a little less tired and less irritable in the mornings.  She continues to note fatigue.  She understands that there are multiple factors related to fatigue.  She will continue working on the compliance with CPAP and for greater than 4 hours each night.  Compliance report reveals optimal compliance.  We have discussed working on healthy well-balanced diet and regular exercise.  We will hold off on titration study at this time as she is doing so well on CPAP.  She will follow-up with me in 6 months, sooner if needed.  She verbalizes understanding and agreement with this plan.   No orders of the defined types were placed in this encounter.    No orders of the defined types were placed in this encounter.     I spent 15 minutes with the patient. 50% of this time was spent counseling and educating patient on plan of care and medications.    Debbora Presto, FNP-C 08/30/2019, 3:28 PM [facility] [address] [place], [place] [address] [phone]

## 2019-08-30 ENCOUNTER — Encounter: Payer: Self-pay | Admitting: Family Medicine

## 2019-08-30 ENCOUNTER — Ambulatory Visit (INDEPENDENT_AMBULATORY_CARE_PROVIDER_SITE_OTHER): Payer: BC Managed Care – PPO | Admitting: Family Medicine

## 2019-08-30 ENCOUNTER — Other Ambulatory Visit: Payer: Self-pay

## 2019-08-30 VITALS — BP 123/75 | HR 87 | Temp 97.9°F | Ht 64.0 in | Wt 278.8 lb

## 2019-08-30 DIAGNOSIS — G4733 Obstructive sleep apnea (adult) (pediatric): Secondary | ICD-10-CM | POA: Diagnosis not present

## 2019-08-30 DIAGNOSIS — Z9989 Dependence on other enabling machines and devices: Secondary | ICD-10-CM

## 2019-08-31 DIAGNOSIS — D485 Neoplasm of uncertain behavior of skin: Secondary | ICD-10-CM | POA: Diagnosis not present

## 2019-09-06 DIAGNOSIS — G4733 Obstructive sleep apnea (adult) (pediatric): Secondary | ICD-10-CM | POA: Diagnosis not present

## 2019-09-16 DIAGNOSIS — L0390 Cellulitis, unspecified: Secondary | ICD-10-CM | POA: Diagnosis not present

## 2019-10-06 DIAGNOSIS — G4733 Obstructive sleep apnea (adult) (pediatric): Secondary | ICD-10-CM | POA: Diagnosis not present

## 2019-11-09 DIAGNOSIS — G4733 Obstructive sleep apnea (adult) (pediatric): Secondary | ICD-10-CM | POA: Diagnosis not present

## 2020-02-11 DIAGNOSIS — G4733 Obstructive sleep apnea (adult) (pediatric): Secondary | ICD-10-CM | POA: Diagnosis not present

## 2020-02-17 ENCOUNTER — Other Ambulatory Visit: Payer: Self-pay | Admitting: Obstetrics and Gynecology

## 2020-02-17 DIAGNOSIS — Z1231 Encounter for screening mammogram for malignant neoplasm of breast: Secondary | ICD-10-CM

## 2020-02-28 ENCOUNTER — Other Ambulatory Visit: Payer: Self-pay

## 2020-02-28 ENCOUNTER — Ambulatory Visit (INDEPENDENT_AMBULATORY_CARE_PROVIDER_SITE_OTHER): Payer: BC Managed Care – PPO | Admitting: Family Medicine

## 2020-02-28 ENCOUNTER — Ambulatory Visit: Payer: BLUE CROSS/BLUE SHIELD

## 2020-02-28 ENCOUNTER — Encounter: Payer: Self-pay | Admitting: Family Medicine

## 2020-02-28 VITALS — BP 128/80 | HR 84 | Temp 97.0°F | Ht 64.0 in | Wt 275.0 lb

## 2020-02-28 DIAGNOSIS — Z9989 Dependence on other enabling machines and devices: Secondary | ICD-10-CM | POA: Diagnosis not present

## 2020-02-28 DIAGNOSIS — G4733 Obstructive sleep apnea (adult) (pediatric): Secondary | ICD-10-CM

## 2020-02-28 NOTE — Patient Instructions (Signed)
Please continue using your CPAP regularly. While your insurance requires that you use CPAP at least 4 hours each night on 70% of the nights, I recommend, that you not skip any nights and use it throughout the night if you can. Getting used to CPAP and staying with the treatment long term does take time and patience and discipline. Untreated obstructive sleep apnea when it is moderate to severe can have an adverse impact on cardiovascular health and raise her risk for heart disease, arrhythmias, hypertension, congestive heart failure, stroke and diabetes. Untreated obstructive sleep apnea causes sleep disruption, nonrestorative sleep, and sleep deprivation. This can have an impact on your day to day functioning and cause daytime sleepiness and impairment of cognitive function, memory loss, mood disturbance, and problems focussing. Using CPAP regularly can improve these symptoms.   Follow up in 1 year   Sleep Apnea Sleep apnea affects breathing during sleep. It causes breathing to stop for a short time or to become shallow. It can also increase the risk of:  Heart attack.  Stroke.  Being very overweight (obese).  Diabetes.  Heart failure.  Irregular heartbeat. The goal of treatment is to help you breathe normally again. What are the causes? There are three kinds of sleep apnea:  Obstructive sleep apnea. This is caused by a blocked or collapsed airway.  Central sleep apnea. This happens when the brain does not send the right signals to the muscles that control breathing.  Mixed sleep apnea. This is a combination of obstructive and central sleep apnea. The most common cause of this condition is a collapsed or blocked airway. This can happen if:  Your throat muscles are too relaxed.  Your tongue and tonsils are too large.  You are overweight.  Your airway is too small. What increases the risk?  Being overweight.  Smoking.  Having a small airway.  Being older.  Being  female.  Drinking alcohol.  Taking medicines to calm yourself (sedatives or tranquilizers).  Having family members with the condition. What are the signs or symptoms?  Trouble staying asleep.  Being sleepy or tired during the day.  Getting angry a lot.  Loud snoring.  Headaches in the morning.  Not being able to focus your mind (concentrate).  Forgetting things.  Less interest in sex.  Mood swings.  Personality changes.  Feelings of sadness (depression).  Waking up a lot during the night to pee (urinate).  Dry mouth.  Sore throat. How is this diagnosed?  Your medical history.  A physical exam.  A test that is done when you are sleeping (sleep study). The test is most often done in a sleep lab but may also be done at home. How is this treated?   Sleeping on your side.  Using a medicine to get rid of mucus in your nose (decongestant).  Avoiding the use of alcohol, medicines to help you relax, or certain pain medicines (narcotics).  Losing weight, if needed.  Changing your diet.  Not smoking.  Using a machine to open your airway while you sleep, such as: ? An oral appliance. This is a mouthpiece that shifts your lower jaw forward. ? A CPAP device. This device blows air through a mask when you breathe out (exhale). ? An EPAP device. This has valves that you put in each nostril. ? A BPAP device. This device blows air through a mask when you breathe in (inhale) and breathe out.  Having surgery if other treatments do not work. It is   important to get treatment for sleep apnea. Without treatment, it can lead to:  High blood pressure.  Coronary artery disease.  In men, not being able to have an erection (impotence).  Reduced thinking ability. Follow these instructions at home: Lifestyle  Make changes that your doctor recommends.  Eat a healthy diet.  Lose weight if needed.  Avoid alcohol, medicines to help you relax, and some pain  medicines.  Do not use any products that contain nicotine or tobacco, such as cigarettes, e-cigarettes, and chewing tobacco. If you need help quitting, ask your doctor. General instructions  Take over-the-counter and prescription medicines only as told by your doctor.  If you were given a machine to use while you sleep, use it only as told by your doctor.  If you are having surgery, make sure to tell your doctor you have sleep apnea. You may need to bring your device with you.  Keep all follow-up visits as told by your doctor. This is important. Contact a doctor if:  The machine that you were given to use during sleep bothers you or does not seem to be working.  You do not get better.  You get worse. Get help right away if:  Your chest hurts.  You have trouble breathing in enough air.  You have an uncomfortable feeling in your back, arms, or stomach.  You have trouble talking.  One side of your body feels weak.  A part of your face is hanging down. These symptoms may be an emergency. Do not wait to see if the symptoms will go away. Get medical help right away. Call your local emergency services (911 in the [place] Do not drive yourself to the hospital. Summary  This condition affects breathing during sleep.  The most common cause is a collapsed or blocked airway.  The goal of treatment is to help you breathe normally while you sleep. This information is not intended to replace advice given to you by your health care provider. Make sure you discuss any questions you have with your health care provider. Document Revised: 07/17/2018 Document Reviewed: 05/26/2018 Elsevier Patient Education  2020 Elsevier Inc.  

## 2020-02-28 NOTE — Progress Notes (Signed)
PATIENT: Tonya Knight DOB: 1971-02-01  REASON FOR VISIT: follow up HISTORY FROM: patient  Chief Complaint  Patient presents with  . Follow-up    cpap fu, rm 1, alone, pt is doing well on cpap     HISTORY OF PRESENT ILLNESS: Today 02/28/20 Tonya Knight is a 49 y.o. female here today for follow up for OSA on CPAP.  She continues to do very well on CPAP therapy.  She is using her CPAP nightly.  She feels much better rested with improved sleep quality and increased energy.  She is feeling well today and without complaints.  Compliance report dated 01/25/2020 through 02/23/2020 reveals that she used CPAP every night for compliance of 100%.  She used CPAP greater than 4 hours every night.  Average usage was 9 hours and 1 minute.  Residual AHI was 1.5 on 6 to 16 cm of water and an EPR of 1.  There was no significant leak noted.  HISTORY: (copied from my note on 08/30/2019)  Tonya Knight is a 49 y.o. female here today for follow up for OSA on CPAP. She was diagnosed with severe sleep apnea with prolonged hypoxemia in 04/2019. Auto pap started immediately. Dr Dohmeier has ordered a titration study. She wishes to hold off on this study if possible. She is doing well with CPAP. She does feel less tired and less irritable in the mornings.    Compliance report dated 07/27/2019 through 08/25/2019 reveals that she is used CPAP 29 of the last 30 days for compliance of 97%.  25 days she used CPAP greater than 4 hours for compliance of 83%.  Average usage is 6 hours and 56 minutes AHI 2.2 on 6 to 16 cm of water and an EPR of 1.  There was no significant leak noted.   HISTORY: (copied from Dr Dohmeier's note on 05/13/2019)  [name] a63 y.o.year old right handedCaucasianfemalepatientseen hereon7/30/2020in referral from Tonya Gravel, MD. Chiefconcernaccording to patient :"My husband is concerned about my snoring, and  apneas'.  She has ahas a past medical history of Anxiety.She is considered obese, too.She gained weight after her foot surgery, is impaired and limited in ambulation. 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 a high risk for OSA.The patientnever had asleep study .   Sleeprelevant medical history: Nocturia1-2,occasional night terror, or night mares. Yelling in her sleep.Tonsillectomy: none, cervical spine surgery/ENT; none.  She has L 3-4-5 partial vertebral fracture ( the bone processes) Foot surgery on the left June 30 s. [name] history:Noother family member on CPAP with OSA, insomnia, sleep walking. She was an only child and her parents sleep well.  Social history:Patient is working from [employer] lives in a household with 3persons total .Family status is married, with39 year old son.The patient currently worksfrom home-Pets are present.1 cat. Tobacco use; none. ETOH use: 1-2 month, Caffeine intake in form of Coffee(2 cups) Soda(1/ week) Tea (none)nor energy drinks. Regular exercise; none.   Sleep habits are as follows:The patient's dinner time is between5.30-6PM. Spends evening at home. The patient goes to bed at9PM and goes to sleep in less than 30 minutes. Shecontinues to sleep for2-3hours, wakes for2bathroom breaks, the first time at 1-2AM. No TV and no clock in the bedroom. The preferred sleep position islateral, with the support of1 big bodypillows.  Dreams are reportedly frequent, some arevivid. The bedroom is cool, quiet and dark.  5.50AM is the usual rise time. The patient wakes  up spontaneously between 6-7 ifwithoutan alarm.  She reports not feeling refreshed or restored in AM, with residual fatigue.  Naps are taken inearly afternoon -frequently, lastingupto 11minutes and are more refreshing than nocturnal sleep.    REVIEW OF SYSTEMS: Out of a complete 14 system  review of symptoms, the patient complains only of the following symptoms, fatigue and all other reviewed systems are negative.  ESS: 8 FSS: 41  ALLERGIES: No Known Allergies  HOME MEDICATIONS: Outpatient Medications Prior to Visit  Medication Sig Dispense Refill  . ARIPiprazole (ABILIFY) 5 MG tablet Take 5 mg by mouth daily.    . B Complex Vitamins (VITAMIN B COMPLEX PO) vitamin B complex    . Cholecalciferol (VITAMIN D) 50 MCG (2000 UT) tablet Take 2,000 Units by mouth daily.     . clonazePAM (KLONOPIN) 0.5 MG tablet Take 2 mg by mouth daily.     . cyclobenzaprine (FLEXERIL) 10 MG tablet Take 10 mg by mouth as needed for muscle spasms.     Marland Kitchen escitalopram (LEXAPRO) 20 MG tablet Take 20 mg by mouth daily.    Marland Kitchen HYDROcodone-acetaminophen (NORCO/VICODIN) 5-325 MG tablet Take 1 tablet by mouth as needed.     . Melatonin 5 MG CAPS Take 1 capsule by mouth at bedtime as needed.    . meloxicam (MOBIC) 15 MG tablet Take 15 mg by mouth daily.    . simvastatin (ZOCOR) 10 MG tablet Take 10 mg by mouth daily.     No facility-administered medications prior to visit.    PAST MEDICAL HISTORY: Past Medical History:  Diagnosis Date  . Anxiety     PAST SURGICAL HISTORY: Past Surgical History:  Procedure Laterality Date  . LASIK    . left foot surgery Left 04/13/2019   patient reported    FAMILY HISTORY: Family History  Problem Relation Age of Onset  . Hyperlipidemia Mother   . Hyperlipidemia Father   . Breast cancer Paternal Aunt   . Heart attack Maternal Grandfather   . Cancer Paternal Grandfather     SOCIAL HISTORY: Social History   Socioeconomic History  . Marital status: Married    Spouse name: Not on file  . Number of children: Not on file  . Years of education: Not on file  . Highest education level: Not on file  Occupational History  . Not on file  Tobacco Use  . Smoking status: Never Smoker  . Smokeless tobacco: Never Used  Substance and Sexual Activity  . Alcohol  use: Yes  . Drug use: Not Currently  . Sexual activity: Not on file  Other Topics Concern  . Not on file  Social History Narrative  . Not on file   Social Determinants of Health   Financial Resource Strain:   . Difficulty of Paying Living Expenses:   Food Insecurity:   . Worried About Charity fundraiser in the Last Year:   . Arboriculturist in the Last Year:   Transportation Needs:   . Film/video editor (Medical):   Marland Kitchen Lack of Transportation (Non-Medical):   Physical Activity:   . Days of Exercise per Week:   . Minutes of Exercise per Session:   Stress:   . Feeling of Stress :   Social Connections:   . Frequency of Communication with Friends and Family:   . Frequency of Social Gatherings with Friends and Family:   . Attends Religious Services:   . Active Member of Clubs or Organizations:   .  Attends Archivist Meetings:   Marland Kitchen Marital Status:   Intimate Partner Violence:   . Fear of Current or Ex-Partner:   . Emotionally Abused:   Marland Kitchen Physically Abused:   . Sexually Abused:       PHYSICAL EXAM  Vitals:   02/28/20 1524  BP: 128/80  Pulse: 84  Temp: (!) 97 F (36.1 C)  Weight: 275 lb (124.7 kg)  Height: 5\' 4"  (1.626 m)   Body mass index is 47.2 kg/m.  Generalized: Well developed, in no acute distress  Cardiology: normal rate and rhythm, no murmur noted Respiratory: clear to auscultation bilaterally  Neurological examination  Mentation: Alert oriented to time, place, history taking. Follows all commands speech and language fluent Cranial nerve II-XII: Pupils were equal round reactive to light. Extraocular movements were full, visual field were full Motor: The motor testing reveals 5 over 5 strength of all 4 extremities. Good symmetric motor tone is noted throughout.   Gait and station: Gait is normal.   DIAGNOSTIC DATA (LABS, IMAGING, TESTING) - I reviewed patient records, labs, notes, testing and imaging myself where available.  No flowsheet  data found.   Lab Results  Component Value Date   WBC 12.4 (H) 06/29/2007   HGB 11.6 (L) 06/29/2007   HCT 33.4 (L) 06/29/2007   MCV 90.1 06/29/2007   PLT 150 06/29/2007   No results found for: NA, K, CL, CO2, GLUCOSE, BUN, CREATININE, CALCIUM, PROT, ALBUMIN, AST, ALT, ALKPHOS, BILITOT, GFRNONAA, GFRAA No results found for: CHOL, HDL, LDLCALC, LDLDIRECT, TRIG, CHOLHDL No results found for: HGBA1C No results found for: VITAMINB12 No results found for: TSH     ASSESSMENT AND PLAN 49 y.o. year old female  has a past medical history of Anxiety. here with     ICD-10-CM   1. OSA on CPAP  G47.33 For home use only DME continuous positive airway pressure (CPAP)   Z99.89     [name] is doing very well with CPAP therapy.  Compliance report reveals excellent compliance.  She was encouraged to continue using CPAP nightly and for greater than 4 hours each night.  We will send orders for continued supplies to her DME today.  She was encouraged to continue working on healthy lifestyle habits with well-balanced diet, regular exercise and adequate hydration.  She will follow-up with me in 1 year, sooner if needed.  She verbalizes understanding and agreement with this plan.   Orders Placed This Encounter  Procedures  . For home use only DME continuous positive airway pressure (CPAP)    Supplies    Order Specific Question:   Length of Need    Answer:   Lifetime    Order Specific Question:   Patient has OSA or probable OSA    Answer:   Yes    Order Specific Question:   Is the patient currently using CPAP in the home    Answer:   Yes    Order Specific Question:   Settings    Answer:   Other see comments    Order Specific Question:   CPAP supplies needed    Answer:   Mask, headgear, cushions, filters, heated tubing and water chamber     No orders of the defined types were placed in this encounter.     I spent 15 minutes with the patient. 50% of this time was spent counseling and educating  patient on plan of care and medications.    Debbora Presto, FNP-C 02/28/2020, 4:02 PM [facility] [address] [place],  [address] [phone]

## 2020-02-29 ENCOUNTER — Other Ambulatory Visit: Payer: Self-pay

## 2020-02-29 ENCOUNTER — Ambulatory Visit
Admission: RE | Admit: 2020-02-29 | Discharge: 2020-02-29 | Disposition: A | Discharge status: Home / Self Care | Payer: BC Managed Care – PPO | Source: Ambulatory Visit | Attending: Obstetrics and Gynecology | Admitting: Obstetrics and Gynecology

## 2020-02-29 DIAGNOSIS — Z1231 Encounter for screening mammogram for malignant neoplasm of breast: Secondary | ICD-10-CM | POA: Diagnosis not present

## 2020-02-29 NOTE — Progress Notes (Signed)
Order for cpap supplies sent to [facility] via [employer]. Confirmation received that the order transmitted was successful.

## 2020-03-16 DIAGNOSIS — Z6841 Body Mass Index (BMI) 40.0 and over, adult: Secondary | ICD-10-CM | POA: Diagnosis not present

## 2020-03-16 DIAGNOSIS — Z01419 Encounter for gynecological examination (general) (routine) without abnormal findings: Secondary | ICD-10-CM | POA: Diagnosis not present

## 2020-05-11 DIAGNOSIS — G4733 Obstructive sleep apnea (adult) (pediatric): Secondary | ICD-10-CM | POA: Diagnosis not present

## 2020-05-20 DIAGNOSIS — Z20822 Contact with and (suspected) exposure to covid-19: Secondary | ICD-10-CM | POA: Diagnosis not present

## 2020-06-21 DIAGNOSIS — Z0000 Encounter for general adult medical examination without abnormal findings: Secondary | ICD-10-CM | POA: Diagnosis not present

## 2020-06-21 DIAGNOSIS — E7800 Pure hypercholesterolemia, unspecified: Secondary | ICD-10-CM | POA: Diagnosis not present

## 2020-06-21 DIAGNOSIS — E785 Hyperlipidemia, unspecified: Secondary | ICD-10-CM | POA: Diagnosis not present

## 2020-06-23 DIAGNOSIS — G4733 Obstructive sleep apnea (adult) (pediatric): Secondary | ICD-10-CM | POA: Diagnosis not present

## 2020-06-28 DIAGNOSIS — F419 Anxiety disorder, unspecified: Secondary | ICD-10-CM | POA: Diagnosis not present

## 2020-06-28 DIAGNOSIS — Z0000 Encounter for general adult medical examination without abnormal findings: Secondary | ICD-10-CM | POA: Diagnosis not present

## 2020-07-11 DIAGNOSIS — N959 Unspecified menopausal and perimenopausal disorder: Secondary | ICD-10-CM | POA: Diagnosis not present

## 2020-07-11 DIAGNOSIS — Z6841 Body Mass Index (BMI) 40.0 and over, adult: Secondary | ICD-10-CM | POA: Diagnosis not present

## 2020-09-01 DIAGNOSIS — Z6841 Body Mass Index (BMI) 40.0 and over, adult: Secondary | ICD-10-CM | POA: Diagnosis not present

## 2020-09-01 DIAGNOSIS — Z79890 Hormone replacement therapy (postmenopausal): Secondary | ICD-10-CM | POA: Diagnosis not present

## 2020-09-09 IMAGING — MG DIGITAL SCREENING BILATERAL MAMMOGRAM WITH TOMO AND CAD
6 of 10 series · 6 of 30 positions shown · non-contrast
Comparison: Previous exam(s).

CLINICAL DATA: Screening.

EXAM:
DIGITAL SCREENING BILATERAL MAMMOGRAM WITH TOMO AND CAD

[R CC synth-2D]
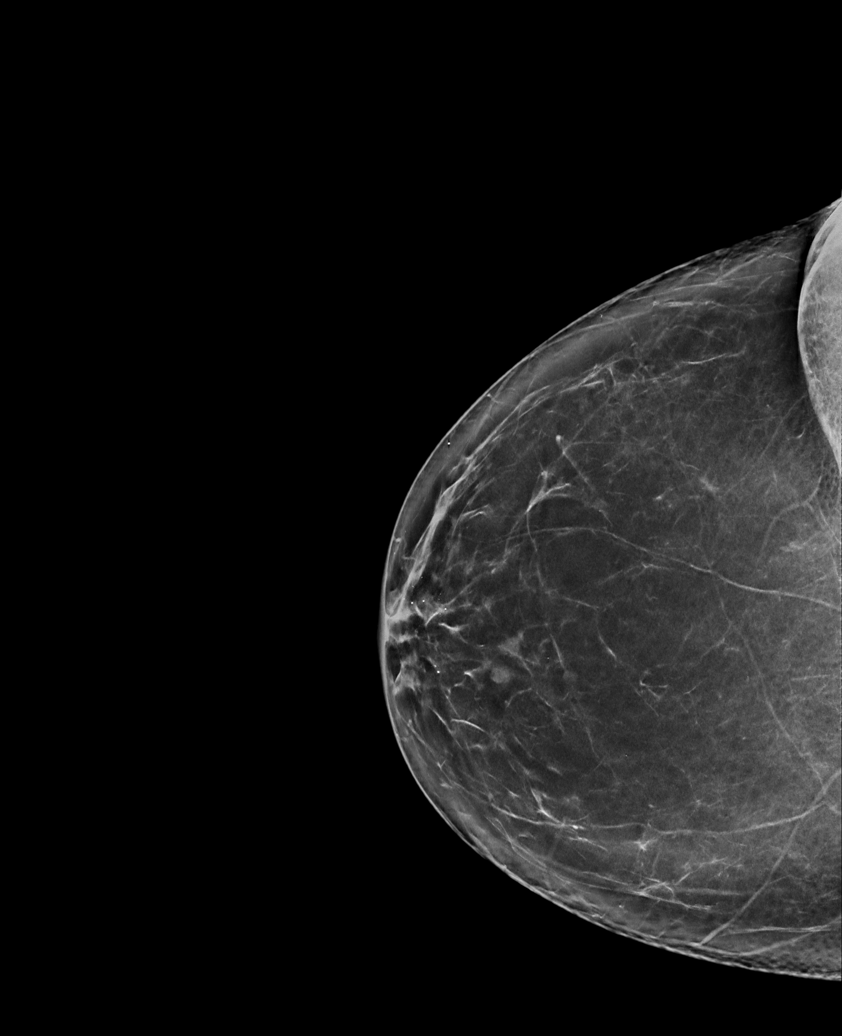

[R XCCL synth-2D]
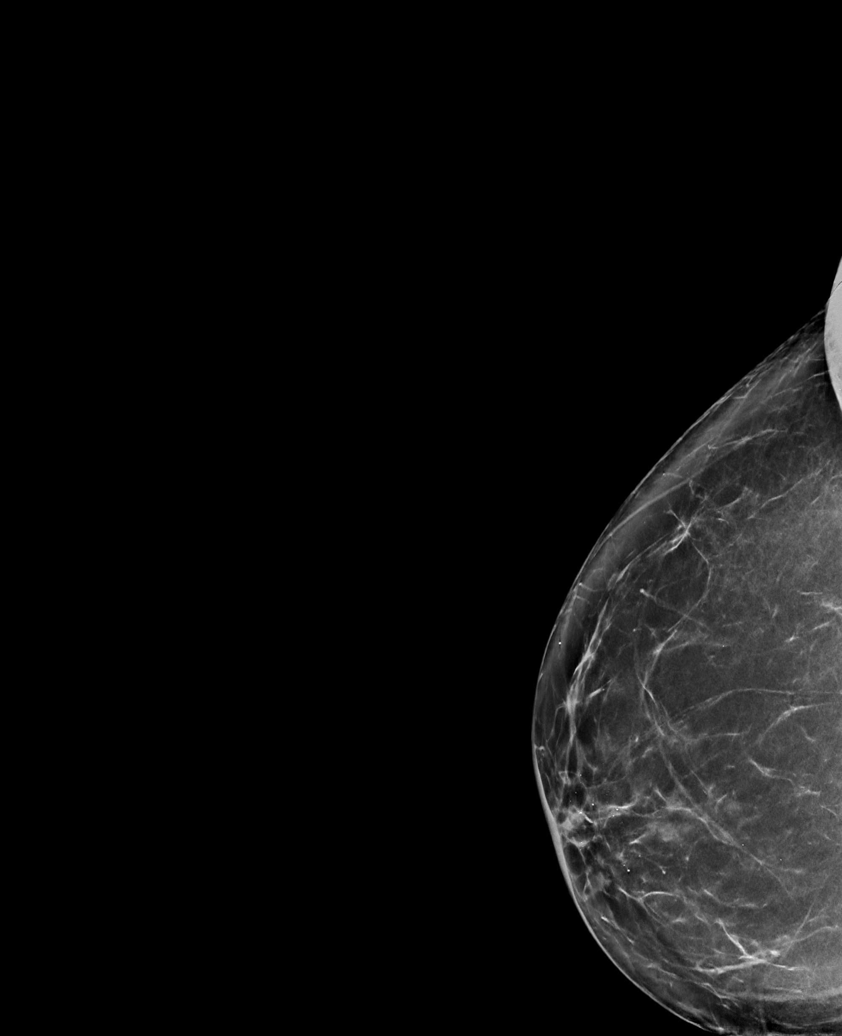

[L CC synth-2D]
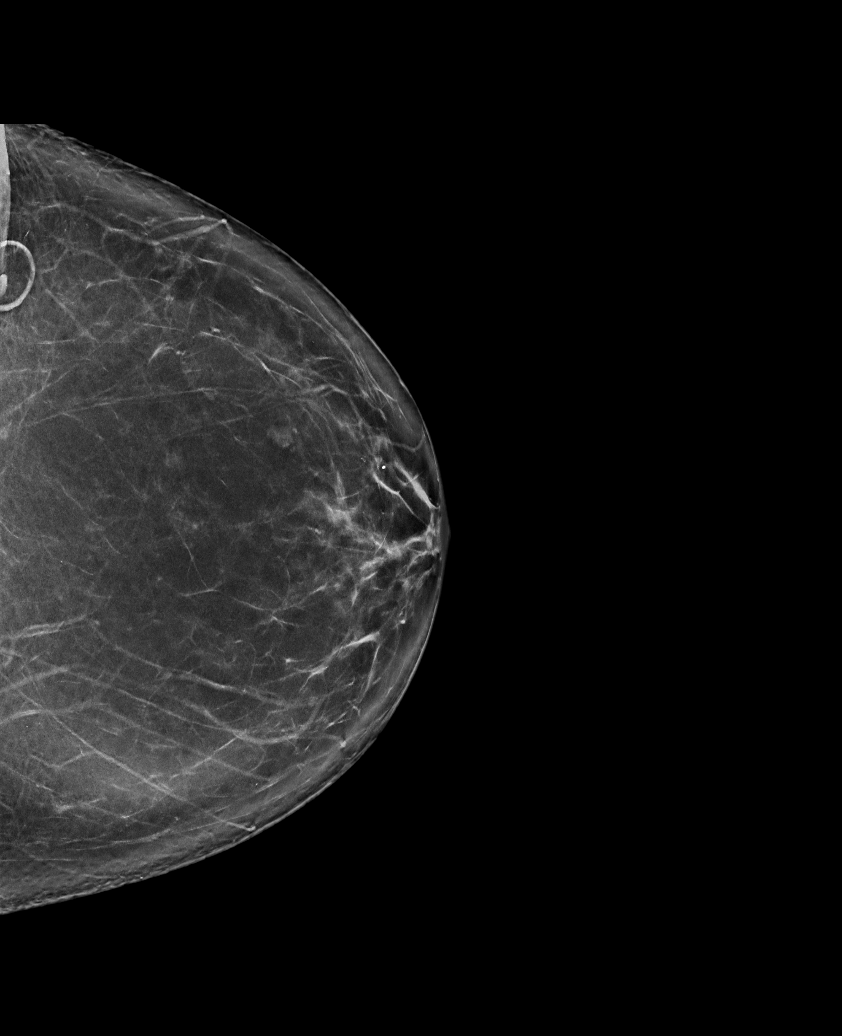

[L MLO synth-2D]
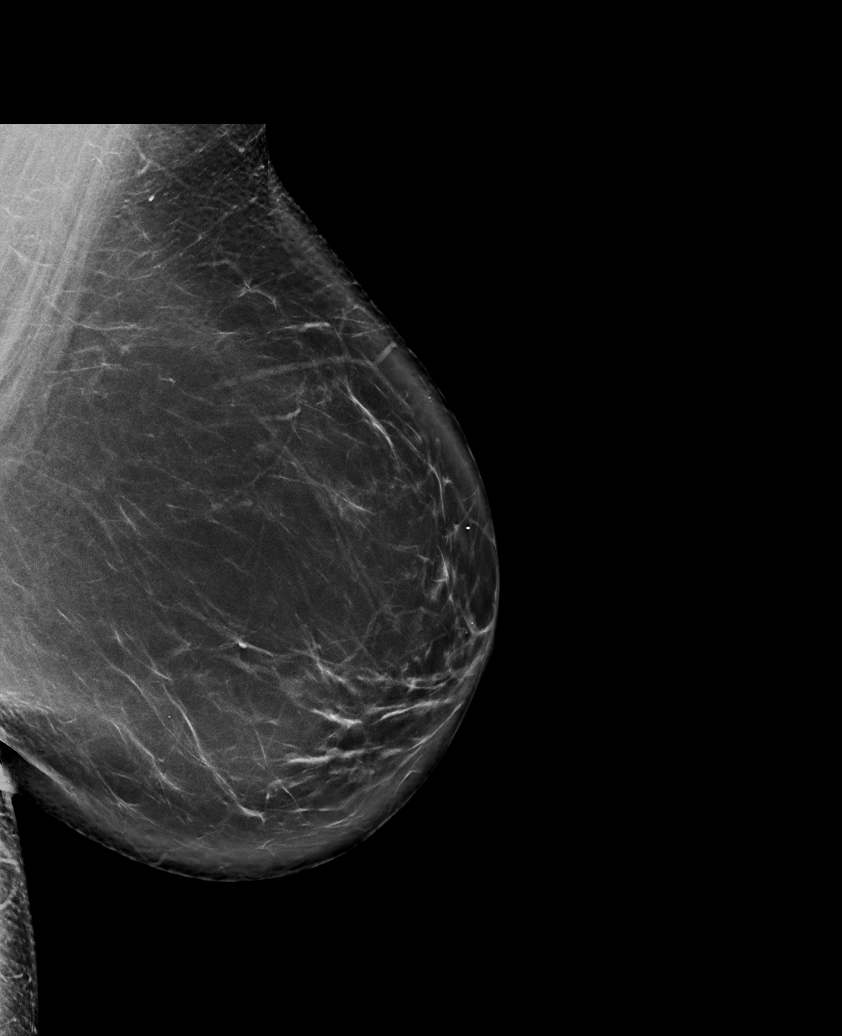

[R MLO synth-2D]
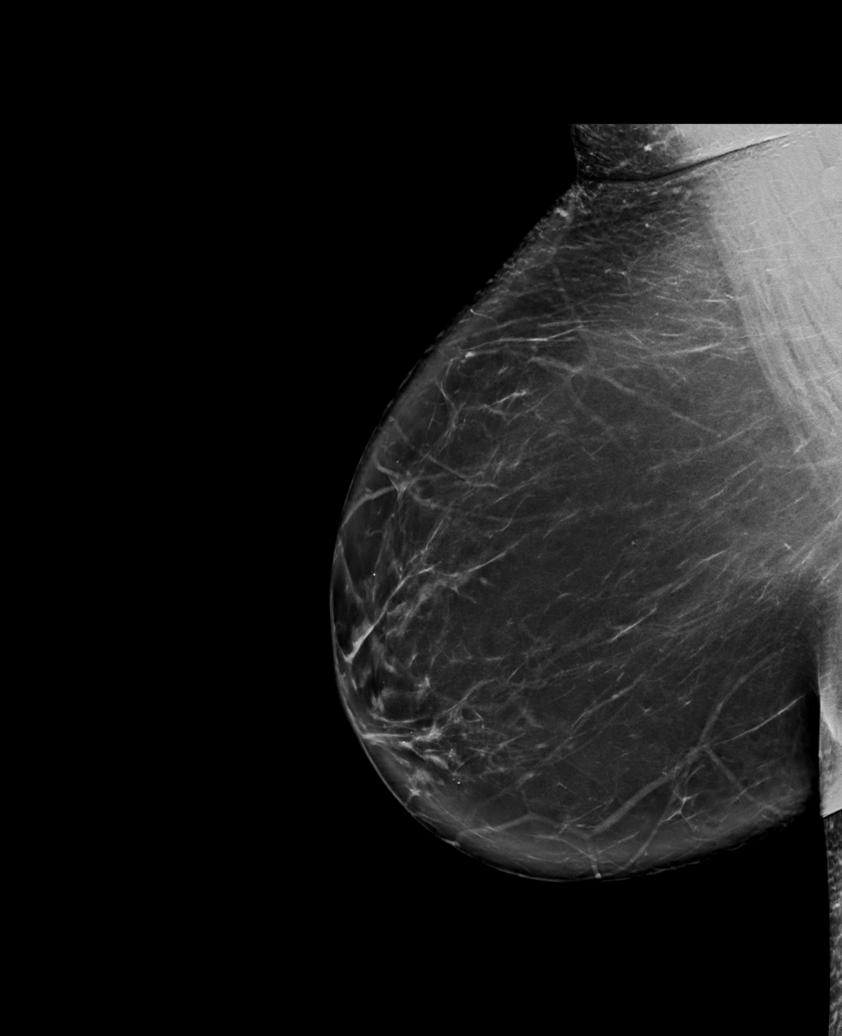

[R XCCL tomo · tomo slice 41/80.0]
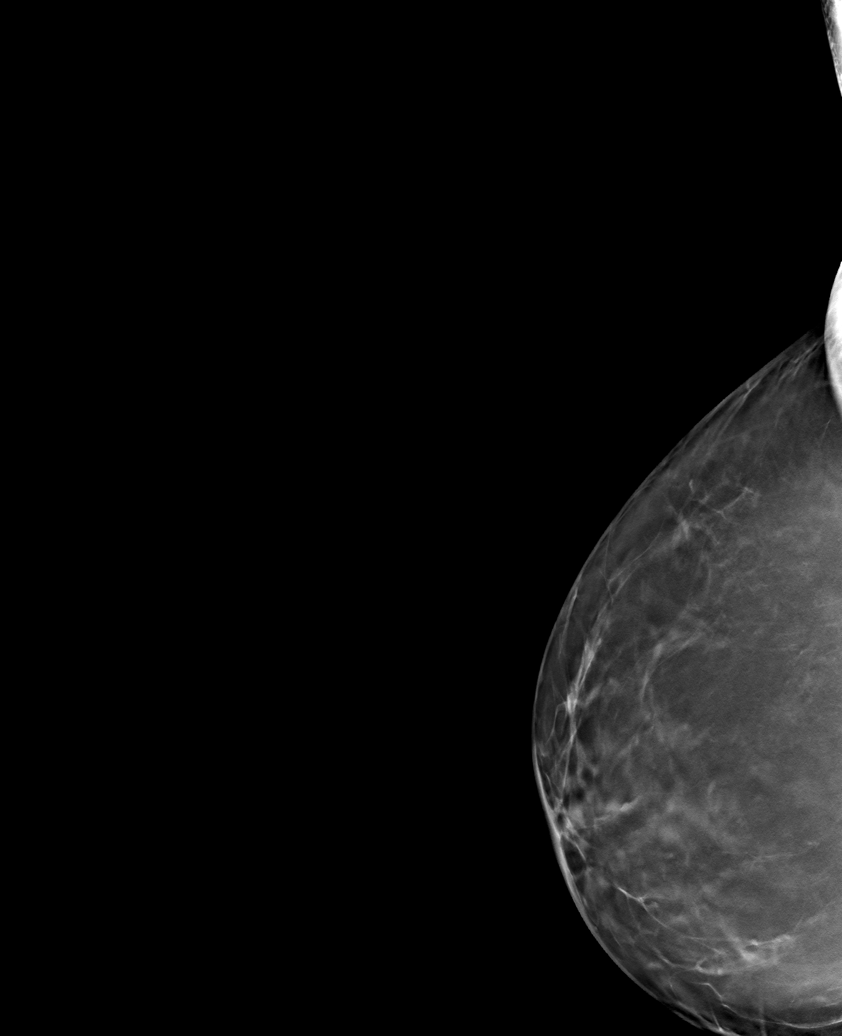

[6 of 30 positions shown; findings below may reference images not displayed]

ACR Breast Density Category b: There are scattered areas of
fibroglandular density.
FINDINGS: There are no findings suspicious for malignancy. Images were
processed with CAD.
IMPRESSION: No mammographic evidence of malignancy. A result letter of this
screening mammogram will be mailed directly to the patient.

RECOMMENDATION:
Screening mammogram in one year. (Code:[id number])

BI-RADS CATEGORY  1: Negative.

## 2020-09-21 DIAGNOSIS — G4733 Obstructive sleep apnea (adult) (pediatric): Secondary | ICD-10-CM | POA: Diagnosis not present

## 2020-09-25 DIAGNOSIS — Z1159 Encounter for screening for other viral diseases: Secondary | ICD-10-CM | POA: Diagnosis not present

## 2020-09-28 DIAGNOSIS — K640 First degree hemorrhoids: Secondary | ICD-10-CM | POA: Diagnosis not present

## 2020-09-28 DIAGNOSIS — D120 Benign neoplasm of cecum: Secondary | ICD-10-CM | POA: Diagnosis not present

## 2020-09-28 DIAGNOSIS — Z1211 Encounter for screening for malignant neoplasm of colon: Secondary | ICD-10-CM | POA: Diagnosis not present

## 2020-11-02 DIAGNOSIS — R300 Dysuria: Secondary | ICD-10-CM | POA: Diagnosis not present

## 2020-12-27 DIAGNOSIS — E7800 Pure hypercholesterolemia, unspecified: Secondary | ICD-10-CM | POA: Diagnosis not present

## 2020-12-27 DIAGNOSIS — E559 Vitamin D deficiency, unspecified: Secondary | ICD-10-CM | POA: Diagnosis not present

## 2020-12-27 DIAGNOSIS — N390 Urinary tract infection, site not specified: Secondary | ICD-10-CM | POA: Diagnosis not present

## 2020-12-27 DIAGNOSIS — F329 Major depressive disorder, single episode, unspecified: Secondary | ICD-10-CM | POA: Diagnosis not present

## 2020-12-28 DIAGNOSIS — E7800 Pure hypercholesterolemia, unspecified: Secondary | ICD-10-CM | POA: Diagnosis not present

## 2020-12-28 DIAGNOSIS — E559 Vitamin D deficiency, unspecified: Secondary | ICD-10-CM | POA: Diagnosis not present

## 2021-01-22 ENCOUNTER — Other Ambulatory Visit: Payer: Self-pay | Admitting: Obstetrics and Gynecology

## 2021-01-22 DIAGNOSIS — Z1231 Encounter for screening mammogram for malignant neoplasm of breast: Secondary | ICD-10-CM

## 2021-02-27 ENCOUNTER — Ambulatory Visit: Payer: BC Managed Care – PPO | Admitting: Family Medicine

## 2021-03-07 NOTE — Patient Instructions (Addendum)
Please continue using your CPAP regularly. While your insurance requires that you use CPAP at least 4 hours each night on 70% of the nights, I recommend, that you not skip any nights and use it throughout the night if you can. Getting used to CPAP and staying with the treatment long term does take time and patience and discipline. Untreated obstructive sleep apnea when it is moderate to severe can have an adverse impact on cardiovascular health and raise her risk for heart disease, arrhythmias, hypertension, congestive heart failure, stroke and diabetes. Untreated obstructive sleep apnea causes sleep disruption, nonrestorative sleep, and sleep deprivation. This can have an impact on your day to day functioning and cause daytime sleepiness and impairment of cognitive function, memory loss, mood disturbance, and problems focussing. Using CPAP regularly can improve these symptoms.   Follow up in 1 year   Sleep Apnea Sleep apnea affects breathing during sleep. It causes breathing to stop for a short time or to become shallow. It can also increase the risk of:  Heart attack.  Stroke.  Being very overweight (obese).  Diabetes.  Heart failure.  Irregular heartbeat. The goal of treatment is to help you breathe normally again. What are the causes? There are three kinds of sleep apnea:  Obstructive sleep apnea. This is caused by a blocked or collapsed airway.  Central sleep apnea. This happens when the brain does not send the right signals to the muscles that control breathing.  Mixed sleep apnea. This is a combination of obstructive and central sleep apnea. The most common cause of this condition is a collapsed or blocked airway. This can happen if:  Your throat muscles are too relaxed.  Your tongue and tonsils are too large.  You are overweight.  Your airway is too small.   What increases the risk?  Being overweight.  Smoking.  Having a small airway.  Being older.  Being  female.  Drinking alcohol.  Taking medicines to calm yourself (sedatives or tranquilizers).  Having family members with the condition. What are the signs or symptoms?  Trouble staying asleep.  Being sleepy or tired during the day.  Getting angry a lot.  Loud snoring.  Headaches in the morning.  Not being able to focus your mind (concentrate).  Forgetting things.  Less interest in sex.  Mood swings.  Personality changes.  Feelings of sadness (depression).  Waking up a lot during the night to pee (urinate).  Dry mouth.  Sore throat. How is this diagnosed?  Your medical history.  A physical exam.  A test that is done when you are sleeping (sleep study). The test is most often done in a sleep lab but may also be done at home. How is this treated?  Sleeping on your side.  Using a medicine to get rid of mucus in your nose (decongestant).  Avoiding the use of alcohol, medicines to help you relax, or certain pain medicines (narcotics).  Losing weight, if needed.  Changing your diet.  Not smoking.  Using a machine to open your airway while you sleep, such as: ? An oral appliance. This is a mouthpiece that shifts your lower jaw forward. ? A CPAP device. This device blows air through a mask when you breathe out (exhale). ? An EPAP device. This has valves that you put in each nostril. ? A BPAP device. This device blows air through a mask when you breathe in (inhale) and breathe out.  Having surgery if other treatments do not work. It   is important to get treatment for sleep apnea. Without treatment, it can lead to:  High blood pressure.  Coronary artery disease.  In men, not being able to have an erection (impotence).  Reduced thinking ability.   Follow these instructions at home: Lifestyle  Make changes that your doctor recommends.  Eat a healthy diet.  Lose weight if needed.  Avoid alcohol, medicines to help you relax, and some pain  medicines.  Do not use any products that contain nicotine or tobacco, such as cigarettes, e-cigarettes, and chewing tobacco. If you need help quitting, ask your doctor. General instructions  Take over-the-counter and prescription medicines only as told by your doctor.  If you were given a machine to use while you sleep, use it only as told by your doctor.  If you are having surgery, make sure to tell your doctor you have sleep apnea. You may need to bring your device with you.  Keep all follow-up visits as told by your doctor. This is important. Contact a doctor if:  The machine that you were given to use during sleep bothers you or does not seem to be working.  You do not get better.  You get worse. Get help right away if:  Your chest hurts.  You have trouble breathing in enough air.  You have an uncomfortable feeling in your back, arms, or stomach.  You have trouble talking.  One side of your body feels weak.  A part of your face is hanging down. These symptoms may be an emergency. Do not wait to see if the symptoms will go away. Get medical help right away. Call your local emergency services (911 in the [place] Do not drive yourself to the hospital. Summary  This condition affects breathing during sleep.  The most common cause is a collapsed or blocked airway.  The goal of treatment is to help you breathe normally while you sleep. This information is not intended to replace advice given to you by your health care provider. Make sure you discuss any questions you have with your health care provider. Document Revised: 07/17/2018 Document Reviewed: 05/26/2018 Elsevier Patient Education  2021 Elsevier Inc.  

## 2021-03-07 NOTE — Progress Notes (Signed)
PATIENT: Tonya Knight DOB: 18-Sep-1971  REASON FOR VISIT: follow up HISTORY FROM: patient  Chief Complaint  Patient presents with  . Follow-up    RM 1, alone, pt reports doing well on CPAP therapy.      HISTORY OF PRESENT ILLNESS: 03/08/21 ALL: Tonya Knight returns for follow up for OSA on CPAP. She continues to do well on therapy. She is using CPAP nightly and greater than 4 hours each night. She denies concerns with machine or supplies. She is feeling well, today.      02/28/2020 ALL:  Tonya Knight is a 50 y.o. female here today for follow up for OSA on CPAP.  She continues to do very well on CPAP therapy.  She is using her CPAP nightly.  She feels much better rested with improved sleep quality and increased energy.  She is feeling well today and without complaints.  Compliance report dated 01/25/2020 through 02/23/2020 reveals that she used CPAP every night for compliance of 100%.  She used CPAP greater than 4 hours every night.  Average usage was 9 hours and 1 minute.  Residual AHI was 1.5 on 6 to 16 cm of water and an EPR of 1.  There was no significant leak noted.  HISTORY: (copied from my note on 08/30/2019)  Tonya Knight is a 50 y.o. female here today for follow up for OSA on CPAP. She was diagnosed with severe sleep apnea with prolonged hypoxemia in 04/2019. Auto pap started immediately. Dr Dohmeier has ordered a titration study. She wishes to hold off on this study if possible. She is doing well with CPAP. She does feel less tired and less irritable in the mornings.    Compliance report dated 07/27/2019 through 08/25/2019 reveals that she is used CPAP 29 of the last 30 days for compliance of 97%.  25 days she used CPAP greater than 4 hours for compliance of 83%.  Average usage is 6 hours and 56 minutes AHI 2.2 on 6 to 16 cm of water and an EPR of 1.  There was no significant leak noted.   HISTORY: (copied from Dr Dohmeier's note on  05/13/2019)  Tonya Knight a11 y.o.year old right handedCaucasianfemalepatientseen hereon7/30/2020in referral from [name], MD. Chiefconcernaccording to patient :"My husband is concerned about my snoring, and apneas'.  She has ahas a past medical history of Anxiety.She is considered obese, too.She gained weight after her foot surgery, is impaired and limited in ambulation. 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 a high risk for OSA.The patientnever had asleep study .   Sleeprelevant medical history: Nocturia1-2,occasional night terror, or night mares. Yelling in her sleep.Tonsillectomy: none, cervical spine surgery/ENT; none.  She has L 3-4-5 partial vertebral fracture ( the bone processes) Foot surgery on the left June 30 s. [name] history:Noother family member on CPAP with OSA, insomnia, sleep walking. She was an only child and her parents sleep well.  Social history:Patient is working from [employer] lives in a household with 3persons total .Family status is married, with46 year old son.The patient currently worksfrom home-Pets are present.1 cat. Tobacco use; none. ETOH use: 1-2 month, Caffeine intake in form of Coffee(2 cups) Soda(1/ week) Tea (none)nor energy drinks. Regular exercise; none.   Sleep habits are as follows:The patient's dinner time is between5.30-6PM. Spends evening at home. The patient goes to bed at9PM and goes to sleep in less than 30 minutes. Shecontinues to sleep for2-3hours, wakes for2bathroom breaks,  the first time at 1-2AM. No TV and no clock in the bedroom. The preferred sleep position islateral, with the support of1 big bodypillows.  Dreams are reportedly frequent, some arevivid. The bedroom is cool, quiet and dark.  5.50AM is the usual rise time. The patient wakes up spontaneously between 6-7 ifwithoutan alarm.  She  reports not feeling refreshed or restored in AM, with residual fatigue.  Naps are taken inearly afternoon -frequently, lastingupto 47minutes and are more refreshing than nocturnal sleep.    REVIEW OF SYSTEMS: Out of a complete 14 system review of symptoms, the patient complains only of the following symptoms, fatigue and all other reviewed systems are negative.  ESS: 8 FSS: 41  ALLERGIES: No Known Allergies  HOME MEDICATIONS: Outpatient Medications Prior to Visit  Medication Sig Dispense Refill  . ARIPiprazole (ABILIFY) 5 MG tablet Take 5 mg by mouth daily.    . B Complex Vitamins (VITAMIN B COMPLEX PO) vitamin B complex    . Cholecalciferol (VITAMIN D) 50 MCG (2000 UT) tablet Take 2,000 Units by mouth daily.     . clonazePAM (KLONOPIN) 0.5 MG tablet Take 2 mg by mouth daily.     . cyclobenzaprine (FLEXERIL) 10 MG tablet Take 10 mg by mouth as needed for muscle spasms.     Marland Kitchen escitalopram (LEXAPRO) 20 MG tablet Take 20 mg by mouth daily.    Marland Kitchen HYDROcodone-acetaminophen (NORCO/VICODIN) 5-325 MG tablet Take 1 tablet by mouth as needed.     . Melatonin 5 MG CAPS Take 1 capsule by mouth at bedtime as needed.    . meloxicam (MOBIC) 15 MG tablet Take 15 mg by mouth daily.    . simvastatin (ZOCOR) 10 MG tablet Take 10 mg by mouth daily.     No facility-administered medications prior to visit.    PAST MEDICAL HISTORY: Past Medical History:  Diagnosis Date  . Anxiety     PAST SURGICAL HISTORY: Past Surgical History:  Procedure Laterality Date  . LASIK    . left foot surgery Left 04/13/2019   patient reported    FAMILY HISTORY: Family History  Problem Relation Age of Onset  . Hyperlipidemia Mother   . Hyperlipidemia Father   . Breast cancer Paternal Aunt   . Heart attack Maternal Grandfather   . Cancer Paternal Grandfather     SOCIAL HISTORY: Social History   Socioeconomic History  . Marital status: Married    Spouse name: Not on file  . Number of children: Not on  file  . Years of education: Not on file  . Highest education level: Not on file  Occupational History  . Not on file  Tobacco Use  . Smoking status: Never Smoker  . Smokeless tobacco: Never Used  Substance and Sexual Activity  . Alcohol use: Yes  . Drug use: Not Currently  . Sexual activity: Not on file  Other Topics Concern  . Not on file  Social History Narrative  . Not on file   Social Determinants of Health   Financial Resource Strain: Not on file  Food Insecurity: Not on file  Transportation Needs: Not on file  Physical Activity: Not on file  Stress: Not on file  Social Connections: Not on file  Intimate Partner Violence: Not on file      PHYSICAL EXAM  Vitals:   03/08/21 0821  BP: 113/73  Pulse: 76  Weight: 281 lb (127.5 kg)  Height: 5\' 4"  (1.626 m)   Body mass index is 48.23 kg/m.  Generalized: Well developed, in no acute distress  Cardiology: normal rate and rhythm, no murmur noted Respiratory: clear to auscultation bilaterally  Neurological examination  Mentation: Alert oriented to time, place, history taking. Follows all commands speech and language fluent Cranial nerve II-XII: Pupils were equal round reactive to light. Extraocular movements were full, visual field were full Motor: The motor testing reveals 5 over 5 strength of all 4 extremities. Good symmetric motor tone is noted throughout.   Gait and station: Gait is normal.   DIAGNOSTIC DATA (LABS, IMAGING, TESTING) - I reviewed patient records, labs, notes, testing and imaging myself where available.  No flowsheet data found.   Lab Results  Component Value Date   WBC 12.4 (H) 06/29/2007   HGB 11.6 (L) 06/29/2007   HCT 33.4 (L) 06/29/2007   MCV 90.1 06/29/2007   PLT 150 06/29/2007   No results found for: NA, K, CL, CO2, GLUCOSE, BUN, CREATININE, CALCIUM, PROT, ALBUMIN, AST, ALT, ALKPHOS, BILITOT, GFRNONAA, GFRAA No results found for: CHOL, HDL, LDLCALC, LDLDIRECT, TRIG, CHOLHDL No  results found for: HGBA1C No results found for: VITAMINB12 No results found for: TSH     ASSESSMENT AND PLAN 50 y.o. year old female  has a past medical history of Anxiety. here with     ICD-10-CM   1. OSA on CPAP  G47.33 For home use only DME continuous positive airway pressure (CPAP)   Z99.89      Tonya Knight is doing very well with CPAP therapy.  Compliance report reveals excellent compliance.  She was encouraged to continue using CPAP nightly and for greater than 4 hours each night.  We will send orders for continued supplies to her DME today.  She was encouraged to continue working on healthy lifestyle habits with well-balanced diet, regular exercise and adequate hydration.  She will follow-up with me in 1 year, sooner if needed.  She verbalizes understanding and agreement with this plan.   Orders Placed This Encounter  Procedures  . For home use only DME continuous positive airway pressure (CPAP)    Supplies    Order Specific Question:   Length of Need    Answer:   Lifetime    Order Specific Question:   Patient has OSA or probable OSA    Answer:   Yes    Order Specific Question:   Is the patient currently using CPAP in the home    Answer:   Yes    Order Specific Question:   Settings    Answer:   Other see comments    Order Specific Question:   CPAP supplies needed    Answer:   Mask, headgear, cushions, filters, heated tubing and water chamber     No orders of the defined types were placed in this encounter.    Debbora Presto, FNP-C 03/08/2021, 8:51 AM [facility] [address] [place], [place] [address] [phone]

## 2021-03-08 ENCOUNTER — Ambulatory Visit (INDEPENDENT_AMBULATORY_CARE_PROVIDER_SITE_OTHER): Payer: BC Managed Care – PPO | Admitting: Family Medicine

## 2021-03-08 ENCOUNTER — Encounter: Payer: Self-pay | Admitting: Family Medicine

## 2021-03-08 VITALS — BP 113/73 | HR 76 | Ht 64.0 in | Wt 281.0 lb

## 2021-03-08 DIAGNOSIS — Z9989 Dependence on other enabling machines and devices: Secondary | ICD-10-CM | POA: Diagnosis not present

## 2021-03-08 DIAGNOSIS — G4733 Obstructive sleep apnea (adult) (pediatric): Secondary | ICD-10-CM

## 2021-03-14 ENCOUNTER — Other Ambulatory Visit: Payer: Self-pay

## 2021-03-14 ENCOUNTER — Ambulatory Visit
Admission: RE | Admit: 2021-03-14 | Discharge: 2021-03-14 | Disposition: A | Discharge status: Home / Self Care | Payer: BC Managed Care – PPO | Source: Ambulatory Visit | Attending: Obstetrics and Gynecology | Admitting: Obstetrics and Gynecology

## 2021-03-14 DIAGNOSIS — Z1231 Encounter for screening mammogram for malignant neoplasm of breast: Secondary | ICD-10-CM

## 2021-04-02 DIAGNOSIS — Z01419 Encounter for gynecological examination (general) (routine) without abnormal findings: Secondary | ICD-10-CM | POA: Diagnosis not present

## 2021-04-02 DIAGNOSIS — Z6841 Body Mass Index (BMI) 40.0 and over, adult: Secondary | ICD-10-CM | POA: Diagnosis not present

## 2021-06-29 DIAGNOSIS — Z0000 Encounter for general adult medical examination without abnormal findings: Secondary | ICD-10-CM | POA: Diagnosis not present

## 2021-06-29 DIAGNOSIS — E785 Hyperlipidemia, unspecified: Secondary | ICD-10-CM | POA: Diagnosis not present

## 2021-07-04 DIAGNOSIS — F329 Major depressive disorder, single episode, unspecified: Secondary | ICD-10-CM | POA: Diagnosis not present

## 2021-07-04 DIAGNOSIS — Z0000 Encounter for general adult medical examination without abnormal findings: Secondary | ICD-10-CM | POA: Diagnosis not present

## 2021-07-04 DIAGNOSIS — F419 Anxiety disorder, unspecified: Secondary | ICD-10-CM | POA: Diagnosis not present

## 2021-07-04 DIAGNOSIS — E7800 Pure hypercholesterolemia, unspecified: Secondary | ICD-10-CM | POA: Diagnosis not present

## 2021-07-17 ENCOUNTER — Ambulatory Visit (INDEPENDENT_AMBULATORY_CARE_PROVIDER_SITE_OTHER): Payer: BC Managed Care – PPO | Admitting: Physician Assistant

## 2021-07-17 ENCOUNTER — Encounter: Payer: Self-pay | Admitting: Physician Assistant

## 2021-07-17 ENCOUNTER — Other Ambulatory Visit: Payer: Self-pay

## 2021-07-17 DIAGNOSIS — L089 Local infection of the skin and subcutaneous tissue, unspecified: Secondary | ICD-10-CM | POA: Diagnosis not present

## 2021-07-17 DIAGNOSIS — L578 Other skin changes due to chronic exposure to nonionizing radiation: Secondary | ICD-10-CM | POA: Diagnosis not present

## 2021-07-17 DIAGNOSIS — L821 Other seborrheic keratosis: Secondary | ICD-10-CM

## 2021-07-17 DIAGNOSIS — S01401A Unspecified open wound of right cheek and temporomandibular area, initial encounter: Secondary | ICD-10-CM | POA: Diagnosis not present

## 2021-07-17 DIAGNOSIS — D1800 Hemangioma unspecified site: Secondary | ICD-10-CM

## 2021-07-17 DIAGNOSIS — Z1283 Encounter for screening for malignant neoplasm of skin: Secondary | ICD-10-CM

## 2021-07-17 DIAGNOSIS — D229 Melanocytic nevi, unspecified: Secondary | ICD-10-CM

## 2021-07-17 DIAGNOSIS — L814 Other melanin hyperpigmentation: Secondary | ICD-10-CM

## 2021-07-17 MED ORDER — MUPIROCIN 2 % EX OINT: 1.0000 "application " | TOPICAL_OINTMENT | Freq: Two times a day (BID) | CUTANEOUS | 1 refills | Status: DC

## 2021-07-18 ENCOUNTER — Encounter: Payer: Self-pay | Admitting: Physician Assistant

## 2021-07-18 NOTE — Progress Notes (Signed)
   Follow-Up Visit   Subjective  Tonya Knight is a 50 y.o. female who presents for the following: Annual Exam (Full body Skin check, no family or personal history of skin cancer or mm).   The following portions of the chart were reviewed this encounter and updated as appropriate:  Tobacco  Allergies  Meds  Problems  Med Hx  Surg Hx  Fam Hx      Objective  Well appearing patient in no apparent distress; mood and affect are within normal limits.  A full examination was performed including scalp, head, eyes, ears, nose, lips, neck, chest, axillae, abdomen, back, buttocks, bilateral upper extremities, bilateral lower extremities, hands, feet, fingers, toes, fingernails, and toenails. All findings within normal limits unless otherwise noted below.  right inner cheek Dark scab on erythematous plaque.   Assessment & Plan  Wound infection right inner cheek    mupirocin ointment (BACTROBAN) 2 % - right inner cheek Apply 1 application topically 2 (two) times daily.  Lentigines - Scattered tan macules - Discussed due to sun exposure - Benign, observe - Call for any changes  Seborrheic Keratoses - Stuck-on, waxy, tan-brown papules and plaques  - Discussed benign etiology and prognosis. - Observe - Call for any changes  Melanocytic Nevi - Tan-brown and/or pink-flesh-colored symmetric macules and papules - Benign appearing on exam today - Observation - Call clinic for new or changing moles - Recommend daily use of broad spectrum spf 30+ sunscreen to sun-exposed areas.   Hemangiomas - Red papules - Discussed benign nature - Observe - Call for any changes  Actinic Damage - diffuse scaly erythematous macules with underlying dyspigmentation - Recommend daily broad spectrum sunscreen SPF 30+ to sun-exposed areas, reapply every 2 hours as needed.  - Call for new or changing lesions.  Skin cancer screening performed today.   I, Pearse Shiffler, PA-C, have  reviewed all documentation's for this visit.  The documentation on 07/18/21 for the exam, diagnosis, procedures and orders are all accurate and complete.

## 2021-08-16 DIAGNOSIS — G4733 Obstructive sleep apnea (adult) (pediatric): Secondary | ICD-10-CM | POA: Diagnosis not present

## 2021-09-15 DIAGNOSIS — G4733 Obstructive sleep apnea (adult) (pediatric): Secondary | ICD-10-CM | POA: Diagnosis not present

## 2022-01-01 DIAGNOSIS — E7800 Pure hypercholesterolemia, unspecified: Secondary | ICD-10-CM | POA: Diagnosis not present

## 2022-01-01 DIAGNOSIS — E559 Vitamin D deficiency, unspecified: Secondary | ICD-10-CM | POA: Diagnosis not present

## 2022-01-08 ENCOUNTER — Other Ambulatory Visit: Payer: Self-pay | Admitting: Registered Nurse

## 2022-01-08 DIAGNOSIS — F419 Anxiety disorder, unspecified: Secondary | ICD-10-CM | POA: Diagnosis not present

## 2022-01-08 DIAGNOSIS — E7800 Pure hypercholesterolemia, unspecified: Secondary | ICD-10-CM

## 2022-01-08 DIAGNOSIS — E559 Vitamin D deficiency, unspecified: Secondary | ICD-10-CM | POA: Diagnosis not present

## 2022-01-29 ENCOUNTER — Other Ambulatory Visit: Payer: Self-pay | Admitting: Obstetrics and Gynecology

## 2022-01-29 DIAGNOSIS — Z1231 Encounter for screening mammogram for malignant neoplasm of breast: Secondary | ICD-10-CM

## 2022-02-08 ENCOUNTER — Ambulatory Visit
Admission: RE | Admit: 2022-02-08 | Discharge: 2022-02-08 | Disposition: A | Discharge status: Home / Self Care | Payer: No Typology Code available for payment source | Source: Ambulatory Visit | Attending: Registered Nurse | Admitting: Registered Nurse

## 2022-02-08 DIAGNOSIS — E7800 Pure hypercholesterolemia, unspecified: Secondary | ICD-10-CM

## 2022-03-04 DIAGNOSIS — M25512 Pain in left shoulder: Secondary | ICD-10-CM | POA: Diagnosis not present

## 2022-03-12 NOTE — Progress Notes (Unsigned)
PATIENT: Tonya Knight DOB: 1971/06/23  REASON FOR VISIT: follow up HISTORY FROM: patient  No chief complaint on file.    HISTORY OF PRESENT ILLNESS:  03/12/22 ALL: Tonya Knight returns for follow up for OSA on CPAP. She continues to do well on therapy. She is using CPAP nightly for about 8-9 hours.      03/08/2022 ALL:  Tonya Knight returns for follow up for OSA on CPAP. She continues to do well on therapy. She is using CPAP nightly and greater than 4 hours each night. She denies concerns with machine or supplies. She is feeling well, today.     02/28/2020 ALL:  Tonya Knight is a 51 y.o. female here today for follow up for OSA on CPAP.  She continues to do very well on CPAP therapy.  She is using her CPAP nightly.  She feels much better rested with improved sleep quality and increased energy.  She is feeling well today and without complaints.  Compliance report dated 01/25/2020 through 02/23/2020 reveals that she used CPAP every night for compliance of 100%.  She used CPAP greater than 4 hours every night.  Average usage was 9 hours and 1 minute.  Residual AHI was 1.5 on 6 to 16 cm of water and an EPR of 1.  There was no significant leak noted.  HISTORY: (copied from my note on 08/30/2019)  Tonya Knight is a 51 y.o. female here today for follow up for OSA on CPAP. She was diagnosed with severe sleep apnea with prolonged hypoxemia in 04/2019. Auto pap started immediately. Dr Dohmeier has ordered a titration study. She wishes to hold off on this study if possible. She is doing well with CPAP. She does feel less tired and less irritable in the mornings.     Compliance report dated 07/27/2019 through 08/25/2019 reveals that she is used CPAP 29 of the last 30 days for compliance of 97%.  25 days she used CPAP greater than 4 hours for compliance of 83%.  Average usage is 6 hours and 56 minutes AHI 2.2 on 6 to 16 cm of water and an EPR of 1.  There was no  significant leak noted.     HISTORY: (copied from Dr Dohmeier's note on 05/13/2019)   Tonya Knight is a 51 y.o. year old right handed Caucasian female patient seen here on  05/13/2019 in referral from Tonya Gravel, MD .  Chief concern according to patient : " My husband is concerned about my snoring, and apneas'.    She has a  has a past medical history of Anxiety. She is considered obese, too.  She gained weight after her foot surgery, is impaired and limited in ambulation. 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 a high risk for OSA. The patient never had a sleep study .    Sleep relevant medical history: Nocturia 1-2 , occasional night terror, or night mares. Yelling in her sleep.  Tonsillectomy: none , cervical spine surgery/ ENT; none.  She has  L 3-4-5 partial vertebral fracture ( the bone processes)  Foot surgery on the left June 30 s. .    Family medical Rachelle Hora history: No  other family member on CPAP with OSA, insomnia, sleep walking. She was an only child and her parents sleep well.     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 Family status is married ,  with 22 year old son. The patient currently works from home- Pets are present. 1 cat.  Tobacco use; none .  ETOH use: 1-2 month, Caffeine intake in form of Coffee( 2 cups ) Soda( 1/ week) Tea ( none) nor energy drinks. Regular exercise; none.     Sleep habits are as follows: The patient's dinner time is between 5.30-6 PM. Spends evening at home.  The patient goes to bed at 9 PM and goes to sleep in less than 30 minutes. She continues to sleep for 2-3hours, wakes for 2 bathroom breaks, the first time at 1-2 AM.  No TV and no clock in the bedroom.  The preferred sleep position is lateral, with the support of 1 big body pillows.  Dreams are reportedly frequent, some are vivid. The bedroom is cool, quiet and dark.  5.50  AM is the usual rise time. The  patient wakes up spontaneously between 6-7 if without an alarm.  She reports not feeling refreshed or restored in AM, with residual fatigue.  Naps are taken in early afternoon -frequently, lasting up to 60 minutes and are more refreshing than nocturnal sleep.    REVIEW OF SYSTEMS: Out of a complete 14 system review of symptoms, the patient complains only of the following symptoms, fatigue and all other reviewed systems are negative.  ESS: 8 FSS: 41  ALLERGIES: No Known Allergies  HOME MEDICATIONS: Outpatient Medications Prior to Visit  Medication Sig Dispense Refill   ARIPiprazole (ABILIFY) 5 MG tablet Take 5 mg by mouth daily.     B Complex Vitamins (VITAMIN B COMPLEX PO) vitamin B complex     Cholecalciferol (VITAMIN D) 50 MCG (2000 UT) tablet Take 2,000 Units by mouth daily.      clonazePAM (KLONOPIN) 0.5 MG tablet Take 2 mg by mouth daily.      cyclobenzaprine (FLEXERIL) 10 MG tablet Take 10 mg by mouth as needed for muscle spasms.      escitalopram (LEXAPRO) 20 MG tablet Take 20 mg by mouth daily.     HYDROcodone-acetaminophen (NORCO/VICODIN) 5-325 MG tablet Take 1 tablet by mouth as needed.      Melatonin 5 MG CAPS Take 1 capsule by mouth at bedtime as needed.     meloxicam (MOBIC) 15 MG tablet Take 15 mg by mouth daily.     mupirocin ointment (BACTROBAN) 2 % Apply 1 application topically 2 (two) times daily. 22 g 1   simvastatin (ZOCOR) 10 MG tablet Take 10 mg by mouth daily.     No facility-administered medications prior to visit.    PAST MEDICAL HISTORY: Past Medical History:  Diagnosis Date   Anxiety     PAST SURGICAL HISTORY: Past Surgical History:  Procedure Laterality Date   LASIK     left foot surgery Left 04/13/2019   patient reported    FAMILY HISTORY: Family History  Problem Relation Age of Onset   Hyperlipidemia Mother    Hyperlipidemia Father    Breast cancer Paternal Aunt    Heart attack Maternal Grandfather    Cancer Paternal Grandfather      SOCIAL HISTORY: Social History   Socioeconomic History   Marital status: Married    Spouse name: Not on file   Number of children: Not on file   Years of education: Not on file   Highest education level: Not on file  Occupational History   Not on file  Tobacco Use   Smoking status: Never   Smokeless tobacco: Never  Vaping Use  Vaping Use: Never used  Substance and Sexual Activity   Alcohol use: Yes   Drug use: Not Currently   Sexual activity: Not on file  Other Topics Concern   Not on file  Social History Narrative   Not on file   Social Determinants of Health   Financial Resource Strain: Not on file  Food Insecurity: Not on file  Transportation Needs: Not on file  Physical Activity: Not on file  Stress: Not on file  Social Connections: Not on file  Intimate Partner Violence: Not on file      PHYSICAL EXAM  There were no vitals filed for this visit.  There is no height or weight on file to calculate BMI.  Generalized: Well developed, in no acute distress  Cardiology: normal rate and rhythm, no murmur noted Respiratory: clear to auscultation bilaterally  Neurological examination  Mentation: Alert oriented to time, place, history taking. Follows all commands speech and language fluent Cranial nerve II-XII: Pupils were equal round reactive to light. Extraocular movements were full, visual field were full Motor: The motor testing reveals 5 over 5 strength of all 4 extremities. Good symmetric motor tone is noted throughout.   Gait and station: Gait is normal.   DIAGNOSTIC DATA (LABS, IMAGING, TESTING) - I reviewed patient records, labs, notes, testing and imaging myself where available.      View : No data to display.           Lab Results  Component Value Date   WBC 12.4 (H) 06/29/2007   HGB 11.6 (L) 06/29/2007   HCT 33.4 (L) 06/29/2007   MCV 90.1 06/29/2007   PLT 150 06/29/2007   No results found for: NA, K, CL, CO2, GLUCOSE, BUN, CREATININE,  CALCIUM, PROT, ALBUMIN, AST, ALT, ALKPHOS, BILITOT, GFRNONAA, GFRAA No results found for: CHOL, HDL, LDLCALC, LDLDIRECT, TRIG, CHOLHDL No results found for: HGBA1C No results found for: VITAMINB12 No results found for: TSH     ASSESSMENT AND PLAN 51 y.o. year old female  has a past medical history of Anxiety. here with   No diagnosis found.    Tonya Knight is doing very well with CPAP therapy.  Compliance report reveals excellent compliance.  She was encouraged to continue using CPAP nightly and for greater than 4 hours each night.  We will send orders for continued supplies to her DME today.  She was encouraged to continue working on healthy lifestyle habits with well-balanced diet, regular exercise and adequate hydration.  She will follow-up with me in 1 year, sooner if needed.  She verbalizes understanding and agreement with this plan.   No orders of the defined types were placed in this encounter.    No orders of the defined types were placed in this encounter.    Debbora Presto, FNP-C 03/12/2022, 4:53 PM [facility] [address] [place], [place] [address] [phone]

## 2022-03-12 NOTE — Patient Instructions (Incomplete)
Please continue using your CPAP regularly. While your insurance requires that you use CPAP at least 4 hours each night on 70% of the nights, I recommend, that you not skip any nights and use it throughout the night if you can. Getting used to CPAP and staying with the treatment long term does take time and patience and discipline. Untreated obstructive sleep apnea when it is moderate to severe can have an adverse impact on cardiovascular health and raise her risk for heart disease, arrhythmias, hypertension, congestive heart failure, stroke and diabetes. Untreated obstructive sleep apnea causes sleep disruption, nonrestorative sleep, and sleep deprivation. This can have an impact on your day to day functioning and cause daytime sleepiness and impairment of cognitive function, memory loss, mood disturbance, and problems focussing. Using CPAP regularly can improve these symptoms. ? ?DME: [facility] ?Phone: [phone] ? ?Follow up in 1 year  ?

## 2022-03-13 ENCOUNTER — Encounter: Payer: Self-pay | Admitting: Family Medicine

## 2022-03-13 ENCOUNTER — Ambulatory Visit (INDEPENDENT_AMBULATORY_CARE_PROVIDER_SITE_OTHER): Payer: BC Managed Care – PPO | Admitting: Family Medicine

## 2022-03-13 VITALS — BP 115/77 | HR 102 | Ht 64.0 in | Wt 273.0 lb

## 2022-03-13 DIAGNOSIS — Z9989 Dependence on other enabling machines and devices: Secondary | ICD-10-CM

## 2022-03-13 DIAGNOSIS — G4733 Obstructive sleep apnea (adult) (pediatric): Secondary | ICD-10-CM

## 2022-03-13 NOTE — Progress Notes (Signed)
CM sent to [facility] for new order ?

## 2022-03-14 HISTORY — PX: BREAST BIOPSY: SHX20

## 2022-03-18 ENCOUNTER — Ambulatory Visit
Admission: RE | Admit: 2022-03-18 | Discharge: 2022-03-18 | Disposition: A | Discharge status: Home / Self Care | Payer: BC Managed Care – PPO | Source: Ambulatory Visit | Attending: Obstetrics and Gynecology | Admitting: Obstetrics and Gynecology

## 2022-03-18 DIAGNOSIS — Z1231 Encounter for screening mammogram for malignant neoplasm of breast: Secondary | ICD-10-CM

## 2022-03-20 ENCOUNTER — Other Ambulatory Visit: Payer: Self-pay | Admitting: Obstetrics and Gynecology

## 2022-03-20 DIAGNOSIS — R928 Other abnormal and inconclusive findings on diagnostic imaging of breast: Secondary | ICD-10-CM

## 2022-03-20 DIAGNOSIS — M67912 Unspecified disorder of synovium and tendon, left shoulder: Secondary | ICD-10-CM | POA: Diagnosis not present

## 2022-03-26 ENCOUNTER — Ambulatory Visit
Admission: RE | Admit: 2022-03-26 | Discharge: 2022-03-26 | Disposition: A | Discharge status: Home / Self Care | Payer: BC Managed Care – PPO | Source: Ambulatory Visit | Attending: Obstetrics and Gynecology | Admitting: Obstetrics and Gynecology

## 2022-03-26 ENCOUNTER — Other Ambulatory Visit: Payer: Self-pay | Admitting: Obstetrics and Gynecology

## 2022-03-26 DIAGNOSIS — N6320 Unspecified lump in the left breast, unspecified quadrant: Secondary | ICD-10-CM

## 2022-03-26 DIAGNOSIS — R928 Other abnormal and inconclusive findings on diagnostic imaging of breast: Secondary | ICD-10-CM | POA: Diagnosis not present

## 2022-03-29 ENCOUNTER — Ambulatory Visit
Admission: RE | Admit: 2022-03-29 | Discharge: 2022-03-29 | Disposition: A | Discharge status: Home / Self Care | Payer: BC Managed Care – PPO | Source: Ambulatory Visit | Attending: Obstetrics and Gynecology | Admitting: Obstetrics and Gynecology

## 2022-03-29 DIAGNOSIS — R928 Other abnormal and inconclusive findings on diagnostic imaging of breast: Secondary | ICD-10-CM | POA: Diagnosis not present

## 2022-03-29 DIAGNOSIS — N6320 Unspecified lump in the left breast, unspecified quadrant: Secondary | ICD-10-CM

## 2022-03-29 DIAGNOSIS — N6002 Solitary cyst of left breast: Secondary | ICD-10-CM | POA: Diagnosis not present

## 2022-03-29 DIAGNOSIS — D242 Benign neoplasm of left breast: Secondary | ICD-10-CM | POA: Diagnosis not present

## 2022-04-02 DIAGNOSIS — G4733 Obstructive sleep apnea (adult) (pediatric): Secondary | ICD-10-CM | POA: Diagnosis not present

## 2022-04-11 DIAGNOSIS — Z6841 Body Mass Index (BMI) 40.0 and over, adult: Secondary | ICD-10-CM | POA: Diagnosis not present

## 2022-04-11 DIAGNOSIS — Z124 Encounter for screening for malignant neoplasm of cervix: Secondary | ICD-10-CM | POA: Diagnosis not present

## 2022-04-11 DIAGNOSIS — Z01419 Encounter for gynecological examination (general) (routine) without abnormal findings: Secondary | ICD-10-CM | POA: Diagnosis not present

## 2022-04-22 ENCOUNTER — Other Ambulatory Visit: Payer: Self-pay | Admitting: Surgery

## 2022-04-22 DIAGNOSIS — D242 Benign neoplasm of left breast: Secondary | ICD-10-CM | POA: Diagnosis not present

## 2022-04-22 DIAGNOSIS — D0512 Intraductal carcinoma in situ of left breast: Secondary | ICD-10-CM

## 2022-04-24 ENCOUNTER — Other Ambulatory Visit: Payer: Self-pay | Admitting: Surgery

## 2022-04-24 DIAGNOSIS — D0512 Intraductal carcinoma in situ of left breast: Secondary | ICD-10-CM

## 2022-05-02 DIAGNOSIS — G4733 Obstructive sleep apnea (adult) (pediatric): Secondary | ICD-10-CM | POA: Diagnosis not present

## 2022-05-14 HISTORY — PX: BREAST EXCISIONAL BIOPSY: SUR124

## 2022-05-15 ENCOUNTER — Encounter (HOSPITAL_BASED_OUTPATIENT_CLINIC_OR_DEPARTMENT_OTHER): Payer: Self-pay | Admitting: Surgery

## 2022-05-15 ENCOUNTER — Other Ambulatory Visit: Payer: Self-pay

## 2022-05-16 NOTE — Progress Notes (Signed)
       Patient Instructions  The night before surgery:  No food after midnight. ONLY clear liquids after midnight  The day of surgery (if you do NOT have diabetes):  Drink ONE (1) Pre-Surgery Clear Ensure as directed.   This drink was given to you during your hospital  pre-op appointment visit. The pre-op nurse will instruct you on the time to drink the  Pre-Surgery Ensure depending on your surgery time. Finish the drink at the designated time by the pre-op nurse.  Nothing else to drink after completing the  Pre-Surgery Clear Ensure.  The day of surgery (if you have diabetes): Drink ONE (1) Gatorade 2 (G2) as directed. This drink was given to you during your hospital  pre-op appointment visit.  The pre-op nurse will instruct you on the time to drink the   Gatorade 2 (G2) depending on your surgery time. Color of the Gatorade may vary. Red is not allowed. Nothing else to drink after completing the  Gatorade 2 (G2).         If you have questions, please contact your surgeon's office.Patient was provided with CHG cleanser to use at home before the procedure. Patient verbalized understanding of instructions. 

## 2022-05-21 ENCOUNTER — Ambulatory Visit
Admission: RE | Admit: 2022-05-21 | Discharge: 2022-05-21 | Disposition: A | Discharge status: Home / Self Care | Payer: BC Managed Care – PPO | Source: Ambulatory Visit | Attending: Surgery | Admitting: Surgery

## 2022-05-21 DIAGNOSIS — D0512 Intraductal carcinoma in situ of left breast: Secondary | ICD-10-CM

## 2022-05-21 DIAGNOSIS — R928 Other abnormal and inconclusive findings on diagnostic imaging of breast: Secondary | ICD-10-CM | POA: Diagnosis not present

## 2022-05-21 NOTE — Anesthesia Preprocedure Evaluation (Addendum)
Anesthesia Evaluation  Patient identified by MRN, date of birth, ID band Patient awake    Reviewed: Allergy & Precautions, NPO status , Patient's Chart, lab work & pertinent test results  Airway Mallampati: III  TM Distance: >3 FB Neck ROM: Full    Dental no notable dental hx. (+) Teeth Intact, Dental Advisory Given   Pulmonary sleep apnea and Continuous Positive Airway Pressure Ventilation ,    Pulmonary exam normal breath sounds clear to auscultation       Cardiovascular Exercise Tolerance: Good Normal cardiovascular exam Rhythm:Regular Rate:Normal     Neuro/Psych PSYCHIATRIC DISORDERS Anxiety Depression    GI/Hepatic negative GI ROS, Neg liver ROS,   Endo/Other  Morbid obesity  Renal/GU      Musculoskeletal   Abdominal (+) + obese,   Peds  Hematology   Anesthesia Other Findings L Breast CA  Reproductive/Obstetrics                            Anesthesia Physical Anesthesia Plan  ASA: 3  Anesthesia Plan: General   Post-op Pain Management: Tylenol PO (pre-op)* and Ketamine IV*   Induction: Intravenous  PONV Risk Score and Plan: 4 or greater and Treatment may vary due to age or medical condition, Midazolam, Ondansetron, Dexamethasone, TIVA and Propofol infusion  Airway Management Planned: LMA  Additional Equipment: None  Intra-op Plan:   Post-operative Plan:   Informed Consent: I have reviewed the patients History and Physical, chart, labs and discussed the procedure including the risks, benefits and alternatives for the proposed anesthesia with the patient or authorized representative who has indicated his/her understanding and acceptance.     Dental advisory given  Plan Discussed with:   Anesthesia Plan Comments:        Anesthesia Quick Evaluation

## 2022-05-21 NOTE — H&P (Signed)
REFERRING PHYSICIAN: Willey Blade, MD  PROVIDER: Beverlee Nims, MD  MRN: [id number] DOB: 03/06/1971  Subjective   Chief Complaint: New Consultation (Left Breast )   History of Present Illness: Tonya Knight is a 51 y.o. female who is seen as an office consultation for evaluation of New Consultation (Left Breast ) .   This is a pleasant 51 year old female who was found on recent screening mammography to have a small mass in approximate 7 mm in the left breast in the lower outer quadrant. It was at the 4 o'clock position 5 cm from the nipple. This did show a cystic component with septations and a thickened wall. She underwent a biopsy of this by the radiologist which showed an intraductal papilloma. This, however, was felt to be discordant and removal of the mass was recommended. She has had no previous problems regarding her breast. She denies nipple discharge. She has a family history of breast cancer in a paternal aunt. She is otherwise healthy without complaints. She has no cardiopulmonary issues   Review of Systems: A complete review of systems was obtained from the patient. I have reviewed this information and discussed as appropriate with the patient. See HPI as well for other ROS.  ROS   Medical History: Past Medical History:  Diagnosis Date  Anxiety  Asthma, unspecified asthma severity, unspecified whether complicated, unspecified whether persistent  Hyperlipidemia  Sleep apnea   Patient Active Problem List  Diagnosis  Anxiety disorder  Body mass index (BMI) 45.0-49.9, adult (CMS-HCC)  Encounter for general adult medical examination without abnormal findings  Ganglion cyst of left foot  Hyperlipidemia, unspecified  Loud snoring  Low back pain  Major depressive disorder, single episode, unspecified  Meralgia paresthetica  Morbid (severe) obesity due to excess calories (CMS-HCC)  OSA on CPAP  Other long term (current) drug therapy  Panic attack   Pure hypercholesterolemia  Vitamin D deficiency   Past Surgical History:  Procedure Laterality Date  Lisk Surgery 02/2004  Cyst Removal Surgery 03/2020  TOE    No Known Allergies  Current Outpatient Medications on File Prior to Visit  Medication Sig Dispense Refill  ARIPiprazole (ABILIFY) 2 MG tablet Take 1 tablet by mouth once daily  cholecalciferol (VITAMIN D3) 2,000 unit tablet Take by mouth  escitalopram oxalate (LEXAPRO) 20 MG tablet Take 1 tablet by mouth once daily  estradioL (CLIMARA) 0.0375 mg/24 hr patch APPLY 1 PATCH EVERY WEEK BY TRANSDERMAL ROUTE FOR 30 DAYS.  meloxicam (MOBIC) 15 MG tablet Take 1 tablet by mouth once daily  multivitamin tablet  simvastatin (ZOCOR) 10 MG tablet 1 tablet every day by oral route.  TURMERIC ORAL as directed   No current facility-administered medications on file prior to visit.   History reviewed. No pertinent family history.   Social History   Tobacco Use  Smoking Status Former  Types: Cigarettes  Quit date: 1989  Years since quitting: 34.5  Smokeless Tobacco Never    Social History   Socioeconomic History  Marital status: Married  Tobacco Use  Smoking status: Former  Types: Cigarettes  Quit date: 1989  Years since quitting: 34.5  Smokeless tobacco: Never  Substance and Sexual Activity  Alcohol use: Yes  Drug use: Never   Objective:  There were no vitals filed for this visit.  There is no height or weight on file to calculate BMI.  Physical Exam   She appears well on exam  Left breast nipple areolar complex is normal in appearance. There are  no palpable breast masses and no axillary adenopathy  Labs, Imaging and Diagnostic Testing: I have reviewed the mammograms, ultrasound, and pathology results  Assessment and Plan:   Diagnoses and all orders for this visit:  Intraductal papilloma of breast, left    I gave the patient a copy the pathology results. We discussed the diagnosis as well as the  discordant findings on the mammogram and ultrasound. We have recommended proceeding with a radioactive seed guided left breast lumpectomy. I discussed the reasonings for this with the patient. I next described the procedure in detail. We discussed the risk which includes but is not limited to bleeding, infection, injury to surrounding structures, the need for further procedures if malignancy is found, cardiopulmonary issues, postoperative recovery, etc. She understands and wishes to proceed with surgery which will be scheduled.

## 2022-05-22 ENCOUNTER — Encounter (HOSPITAL_BASED_OUTPATIENT_CLINIC_OR_DEPARTMENT_OTHER): Payer: Self-pay | Admitting: Surgery

## 2022-05-22 ENCOUNTER — Other Ambulatory Visit: Payer: Self-pay

## 2022-05-22 ENCOUNTER — Ambulatory Visit
Admission: RE | Admit: 2022-05-22 | Discharge: 2022-05-22 | Disposition: A | Discharge status: Home / Self Care | Payer: BC Managed Care – PPO | Source: Ambulatory Visit | Attending: Surgery | Admitting: Surgery

## 2022-05-22 ENCOUNTER — Ambulatory Visit (HOSPITAL_BASED_OUTPATIENT_CLINIC_OR_DEPARTMENT_OTHER): Payer: BC Managed Care – PPO | Admitting: Anesthesiology

## 2022-05-22 ENCOUNTER — Ambulatory Visit (HOSPITAL_BASED_OUTPATIENT_CLINIC_OR_DEPARTMENT_OTHER)
Admission: RE | Admit: 2022-05-22 | Discharge: 2022-05-22 | Disposition: A | Discharge status: Home / Self Care | Payer: BC Managed Care – PPO | Attending: Surgery | Admitting: Surgery

## 2022-05-22 ENCOUNTER — Encounter (HOSPITAL_BASED_OUTPATIENT_CLINIC_OR_DEPARTMENT_OTHER)
Admission: RE | Disposition: A | Discharge status: Home / Self Care | Payer: Self-pay | Source: Home / Self Care | Attending: Surgery

## 2022-05-22 DIAGNOSIS — Z803 Family history of malignant neoplasm of breast: Secondary | ICD-10-CM | POA: Insufficient documentation

## 2022-05-22 DIAGNOSIS — G4733 Obstructive sleep apnea (adult) (pediatric): Secondary | ICD-10-CM | POA: Diagnosis not present

## 2022-05-22 DIAGNOSIS — F419 Anxiety disorder, unspecified: Secondary | ICD-10-CM | POA: Diagnosis not present

## 2022-05-22 DIAGNOSIS — Z01818 Encounter for other preprocedural examination: Secondary | ICD-10-CM

## 2022-05-22 DIAGNOSIS — F32A Depression, unspecified: Secondary | ICD-10-CM | POA: Insufficient documentation

## 2022-05-22 DIAGNOSIS — F418 Other specified anxiety disorders: Secondary | ICD-10-CM | POA: Diagnosis not present

## 2022-05-22 DIAGNOSIS — N6022 Fibroadenosis of left breast: Secondary | ICD-10-CM | POA: Diagnosis not present

## 2022-05-22 DIAGNOSIS — Z6841 Body Mass Index (BMI) 40.0 and over, adult: Secondary | ICD-10-CM | POA: Diagnosis not present

## 2022-05-22 DIAGNOSIS — N6012 Diffuse cystic mastopathy of left breast: Secondary | ICD-10-CM | POA: Diagnosis not present

## 2022-05-22 DIAGNOSIS — R928 Other abnormal and inconclusive findings on diagnostic imaging of breast: Secondary | ICD-10-CM | POA: Diagnosis not present

## 2022-05-22 DIAGNOSIS — D242 Benign neoplasm of left breast: Secondary | ICD-10-CM | POA: Insufficient documentation

## 2022-05-22 DIAGNOSIS — N6082 Other benign mammary dysplasias of left breast: Secondary | ICD-10-CM | POA: Insufficient documentation

## 2022-05-22 DIAGNOSIS — D0512 Intraductal carcinoma in situ of left breast: Secondary | ICD-10-CM

## 2022-05-22 DIAGNOSIS — Z9989 Dependence on other enabling machines and devices: Secondary | ICD-10-CM | POA: Diagnosis not present

## 2022-05-22 HISTORY — PX: BREAST LUMPECTOMY WITH RADIOACTIVE SEED LOCALIZATION: SHX6424

## 2022-05-22 HISTORY — DX: Hyperlipidemia, unspecified: E78.5

## 2022-05-22 HISTORY — DX: Sleep apnea, unspecified: G47.30

## 2022-05-22 HISTORY — DX: Depression, unspecified: F32.A

## 2022-05-22 HISTORY — DX: Unspecified lump in the left breast, unspecified quadrant: N63.20

## 2022-05-22 SURGERY — BREAST LUMPECTOMY WITH RADIOACTIVE SEED LOCALIZATION
Anesthesia: General | Site: Breast | Laterality: Left

## 2022-05-22 MED ORDER — LACTATED RINGERS IV SOLN: INTRAVENOUS | Status: DC

## 2022-05-22 MED ORDER — HYDROCODONE-ACETAMINOPHEN 5-325 MG PO TABS: 1.0000 | ORAL_TABLET | Freq: Four times a day (QID) | ORAL | 0 refills | Status: DC | PRN

## 2022-05-22 MED ORDER — PROPOFOL 500 MG/50ML IV EMUL
INTRAVENOUS | Status: DC | PRN
  Administered 2022-05-22: 150 ug/kg/min via INTRAVENOUS

## 2022-05-22 MED ORDER — DEXMEDETOMIDINE (PRECEDEX) IN NS 20 MCG/5ML (4 MCG/ML) IV SYRINGE
PREFILLED_SYRINGE | INTRAVENOUS | Status: DC | PRN
  Administered 2022-05-22: 12 ug via INTRAVENOUS

## 2022-05-22 MED ORDER — LIDOCAINE 2% (20 MG/ML) 5 ML SYRINGE
INTRAMUSCULAR | Status: DC | PRN
  Administered 2022-05-22: 60 mg via INTRAVENOUS

## 2022-05-22 MED ORDER — MIDAZOLAM HCL 2 MG/2ML IJ SOLN
INTRAMUSCULAR | Status: AC
  Filled 2022-05-22: qty 2

## 2022-05-22 MED ORDER — CEFAZOLIN SODIUM-DEXTROSE 2-4 GM/100ML-% IV SOLN
INTRAVENOUS | Status: AC
  Filled 2022-05-22: qty 100

## 2022-05-22 MED ORDER — BUPIVACAINE-EPINEPHRINE (PF) 0.5% -1:200000 IJ SOLN
INTRAMUSCULAR | Status: AC
  Filled 2022-05-22: qty 90

## 2022-05-22 MED ORDER — CEFAZOLIN SODIUM-DEXTROSE 2-4 GM/100ML-% IV SOLN
2.0000 g | INTRAVENOUS | Status: AC
  Administered 2022-05-22: 2 g via INTRAVENOUS

## 2022-05-22 MED ORDER — FENTANYL CITRATE (PF) 100 MCG/2ML IJ SOLN
INTRAMUSCULAR | Status: DC | PRN
  Administered 2022-05-22 (×2): 50 ug via INTRAVENOUS

## 2022-05-22 MED ORDER — LIDOCAINE-EPINEPHRINE (PF) 1 %-1:200000 IJ SOLN
INTRAMUSCULAR | Status: AC
  Filled 2022-05-22: qty 30

## 2022-05-22 MED ORDER — PROPOFOL 10 MG/ML IV BOLUS
INTRAVENOUS | Status: DC | PRN
  Administered 2022-05-22: 150 mg via INTRAVENOUS

## 2022-05-22 MED ORDER — ACETAMINOPHEN 500 MG PO TABS
1000.0000 mg | ORAL_TABLET | ORAL | Status: AC
  Administered 2022-05-22: 1000 mg via ORAL

## 2022-05-22 MED ORDER — MIDAZOLAM HCL 5 MG/5ML IJ SOLN
INTRAMUSCULAR | Status: DC | PRN
  Administered 2022-05-22: 2 mg via INTRAVENOUS

## 2022-05-22 MED ORDER — FENTANYL CITRATE (PF) 100 MCG/2ML IJ SOLN
INTRAMUSCULAR | Status: AC
  Filled 2022-05-22: qty 2

## 2022-05-22 MED ORDER — CHLORHEXIDINE GLUCONATE CLOTH 2 % EX PADS: 6.0000 | MEDICATED_PAD | Freq: Once | CUTANEOUS | Status: DC

## 2022-05-22 MED ORDER — BUPIVACAINE-EPINEPHRINE 0.5% -1:200000 IJ SOLN
INTRAMUSCULAR | Status: DC | PRN
  Administered 2022-05-22: 10 mL

## 2022-05-22 MED ORDER — ONDANSETRON HCL 4 MG/2ML IJ SOLN
INTRAMUSCULAR | Status: DC | PRN
  Administered 2022-05-22: 4 mg via INTRAVENOUS

## 2022-05-22 MED ORDER — ACETAMINOPHEN 500 MG PO TABS
ORAL_TABLET | ORAL | Status: AC
Start: 2022-05-22 — End: ?
  Filled 2022-05-22: qty 2

## 2022-05-22 MED ORDER — SODIUM BICARBONATE 4.2 % IV SOLN
INTRAVENOUS | Status: AC
  Filled 2022-05-22: qty 10

## 2022-05-22 MED ORDER — ENSURE PRE-SURGERY PO LIQD: 296.0000 mL | Freq: Once | ORAL | Status: DC

## 2022-05-22 MED ORDER — LIDOCAINE HCL (PF) 1 % IJ SOLN
INTRAMUSCULAR | Status: AC
  Filled 2022-05-22: qty 30

## 2022-05-22 SURGICAL SUPPLY — 48 items
ADH SKN CLS APL DERMABOND .7 (GAUZE/BANDAGES/DRESSINGS) ×1
APL PRP STRL LF DISP 70% ISPRP (MISCELLANEOUS) ×1
APPLIER CLIP 9.375 MED OPEN (MISCELLANEOUS)
APR CLP MED 9.3 20 MLT OPN (MISCELLANEOUS)
BINDER BREAST 3XL (GAUZE/BANDAGES/DRESSINGS) IMPLANT
BINDER BREAST LRG (GAUZE/BANDAGES/DRESSINGS) IMPLANT
BINDER BREAST MEDIUM (GAUZE/BANDAGES/DRESSINGS) IMPLANT
BINDER BREAST XLRG (GAUZE/BANDAGES/DRESSINGS) IMPLANT
BINDER BREAST XXLRG (GAUZE/BANDAGES/DRESSINGS) IMPLANT
BLADE SURG 15 STRL LF DISP TIS (BLADE) ×1 IMPLANT
BLADE SURG 15 STRL SS (BLADE) ×2
CANISTER SUC SOCK COL 7IN (MISCELLANEOUS) IMPLANT
CANISTER SUCT 1200ML W/VALVE (MISCELLANEOUS) IMPLANT
CHLORAPREP W/TINT 26 (MISCELLANEOUS) ×2 IMPLANT
CLIP APPLIE 9.375 MED OPEN (MISCELLANEOUS) IMPLANT
COVER BACK TABLE 60X90IN (DRAPES) ×2 IMPLANT
COVER MAYO STAND STRL (DRAPES) ×2 IMPLANT
COVER PROBE W GEL 5X96 (DRAPES) ×2 IMPLANT
DERMABOND ADVANCED (GAUZE/BANDAGES/DRESSINGS) ×1
DERMABOND ADVANCED .7 DNX12 (GAUZE/BANDAGES/DRESSINGS) ×1 IMPLANT
DRAPE LAPAROSCOPIC ABDOMINAL (DRAPES) ×2 IMPLANT
DRAPE UTILITY XL STRL (DRAPES) ×2 IMPLANT
ELECT REM PT RETURN 9FT ADLT (ELECTROSURGICAL) ×2
ELECTRODE REM PT RTRN 9FT ADLT (ELECTROSURGICAL) ×1 IMPLANT
GAUZE SPONGE 4X4 12PLY STRL LF (GAUZE/BANDAGES/DRESSINGS) IMPLANT
GLOVE SURG SIGNA 7.5 PF LTX (GLOVE) ×2 IMPLANT
GOWN STRL REUS W/ TWL LRG LVL3 (GOWN DISPOSABLE) ×1 IMPLANT
GOWN STRL REUS W/ TWL XL LVL3 (GOWN DISPOSABLE) ×1 IMPLANT
GOWN STRL REUS W/TWL LRG LVL3 (GOWN DISPOSABLE) ×2
GOWN STRL REUS W/TWL XL LVL3 (GOWN DISPOSABLE) ×2
KIT MARKER MARGIN INK (KITS) ×2 IMPLANT
NDL HYPO 25X1 1.5 SAFETY (NEEDLE) ×1 IMPLANT
NEEDLE HYPO 25X1 1.5 SAFETY (NEEDLE) ×2 IMPLANT
NS IRRIG 1000ML POUR BTL (IV SOLUTION) IMPLANT
PACK BASIN DAY SURGERY FS (CUSTOM PROCEDURE TRAY) ×2 IMPLANT
PENCIL SMOKE EVACUATOR (MISCELLANEOUS) ×2 IMPLANT
SLEEVE SCD COMPRESS KNEE MED (STOCKING) ×2 IMPLANT
SPIKE FLUID TRANSFER (MISCELLANEOUS) IMPLANT
SPONGE T-LAP 4X18 ~~LOC~~+RFID (SPONGE) ×2 IMPLANT
SUT MNCRL AB 4-0 PS2 18 (SUTURE) ×2 IMPLANT
SUT SILK 2 0 SH (SUTURE) IMPLANT
SUT VIC AB 3-0 SH 27 (SUTURE) ×2
SUT VIC AB 3-0 SH 27X BRD (SUTURE) ×1 IMPLANT
SYR CONTROL 10ML LL (SYRINGE) ×2 IMPLANT
TOWEL GREEN STERILE FF (TOWEL DISPOSABLE) ×2 IMPLANT
TRAY FAXITRON CT DISP (TRAY / TRAY PROCEDURE) ×2 IMPLANT
TUBE CONNECTING 20X1/4 (TUBING) IMPLANT
YANKAUER SUCT BULB TIP NO VENT (SUCTIONS) IMPLANT

## 2022-05-22 NOTE — Op Note (Signed)
LEFT BREAST LUMPECTOMY WITH RADIOACTIVE SEED LOCALIZATION  Procedure Note  Goodridge Caroll 05/22/2022   Pre-op Diagnosis: LEFT BREAST INTRADUCTAL PAPILLOMA     Post-op Diagnosis: same  Procedure(s): LEFT BREAST LUMPECTOMY WITH RADIOACTIVE SEED LOCALIZATION  Surgeon(s): Coralie Keens, MD Maczis, Carlena Hurl, PA-C  Anesthesia: General  Staff:  Circulator: Lauris Chroman, RN Scrub Person: Lorenza Burton, CST Circulator Assistant: Dolores Lory, RN  Estimated Blood Loss: Minimal               Specimens: sent to pathology  Indications: This is a 51 year old female was found to have a small abnormality in the left breast on screening mammography.  This had both solid and cystic components on ultrasound.  A biopsy showed an intraductal papilloma but this was felt to be discordant by the radiologist so removal was recommended  Procedure: The patient was brought to the operating room and identifies the correct patient.  She was placed upon the operating table and general anesthesia was induced.  Her left breast was prepped and draped in usual sterile fashion.  Using the neoprobe, the seed was located at the 4 o'clock position of the right breast approximate 5 cm from the nipple.  I anesthetized the lower edge of the areola with Marcaine and then made a circumareolar incision with a scalpel.  Using neoprobe I then dissected inferior and lateral toward the radioactive seed.  Using neoprobe I was able to stay widely around seed with electrocautery.  I then completed lumpectomy staying underneath the seed with the cautery.  Once the specimen was removed I marked all margins with paint.  An x-ray was performed on the specimen confirming the radioactive seed and previous biopsy clip were in the specimen.  The specimen was then sent to pathology for evaluation.  I achieved hemostasis with the cautery.  The subcutaneous tissue was then closed with interrupted 3-0 Vicryl sutures and  the skin was closed with running 4-0 Monocryl.  Dermabond was then applied.  The patient was neck placed in a breast binder.  She tolerated the procedure well.  All the counts were correct at the end of the procedure.  The patient was then extubated in the operating room and taken in a stable condition to the recovery room.  Coralie Keens   Date: 05/22/2022  Time: 8:55 AM

## 2022-05-22 NOTE — Anesthesia Postprocedure Evaluation (Signed)
Anesthesia Post Note  Patient: Tonya Knight  Procedure(s) Performed: LEFT BREAST LUMPECTOMY WITH RADIOACTIVE SEED LOCALIZATION (Left: Breast)     Patient location during evaluation: PACU Anesthesia Type: General Level of consciousness: awake and alert Pain management: pain level controlled Vital Signs Assessment: post-procedure vital signs reviewed and stable Respiratory status: spontaneous breathing, nonlabored ventilation, respiratory function stable and patient connected to nasal cannula oxygen Cardiovascular status: blood pressure returned to baseline and stable Postop Assessment: no apparent nausea or vomiting Anesthetic complications: no   No notable events documented.  Last Vitals:  Vitals:   05/22/22 1007 05/22/22 1015  BP:  100/68  Pulse: 75 77  Resp: 15 16  Temp:  (!) 36.2 C  SpO2: 94% 94%    Last Pain:  Vitals:   05/22/22 1015  TempSrc:   PainSc: 0-No pain                 Barnet Glasgow

## 2022-05-22 NOTE — Interval H&P Note (Signed)
History and Physical Interval Note:no change in H and P  05/22/2022 7:51 AM  Tonya Knight  has presented today for surgery, with the diagnosis of LEFT BREAST INTRADUCTAL PAPILLOMA.  The various methods of treatment have been discussed with the patient and family. After consideration of risks, benefits and other options for treatment, the patient has consented to  Procedure(s) with comments: LEFT BREAST LUMPECTOMY WITH RADIOACTIVE SEED LOCALIZATION (Left) - LMA as a surgical intervention.  The patient's history has been reviewed, patient examined, no change in status, stable for surgery.  I have reviewed the patient's chart and labs.  Questions were answered to the patient's satisfaction.     Coralie Keens

## 2022-05-22 NOTE — Anesthesia Procedure Notes (Signed)
Procedure Name: LMA Insertion Date/Time: 05/22/2022 8:31 AM  Performed by: Joshva Labreck, Ernesta Amble, CRNAPre-anesthesia Checklist: Patient identified, Emergency Drugs available, Suction available and Patient being monitored Patient Re-evaluated:Patient Re-evaluated prior to induction Oxygen Delivery Method: Circle system utilized Preoxygenation: Pre-oxygenation with 100% oxygen Induction Type: IV induction Ventilation: Mask ventilation without difficulty LMA: LMA inserted LMA Size: 4.0 Number of attempts: 1 Airway Equipment and Method: Bite block Placement Confirmation: positive ETCO2 Tube secured with: Tape Dental Injury: Teeth and Oropharynx as per pre-operative assessment

## 2022-05-22 NOTE — Discharge Instructions (Addendum)
[place] Office Phone Number [phone]  BREAST BIOPSY/ PARTIAL MASTECTOMY: POST OP INSTRUCTIONS  Always review your discharge instruction sheet given to you by the facility where your surgery was performed.  IF YOU HAVE DISABILITY OR FAMILY LEAVE FORMS, YOU MUST BRING THEM TO THE OFFICE FOR PROCESSING.  DO NOT GIVE THEM TO YOUR DOCTOR.  A prescription for pain medication may be given to you upon discharge.  Take your pain medication as prescribed, if needed.  If narcotic pain medicine is not needed, then you may take acetaminophen (Tylenol) or ibuprofen (Advil) as needed. Take your usually prescribed medications unless otherwise directed If you need a refill on your pain medication, please contact your pharmacy.  They will contact our office to request authorization.  Prescriptions will not be filled after 5pm or on week-ends. You should eat very light the first 24 hours after surgery, such as soup, crackers, pudding, etc.  Resume your normal diet the day after surgery. Most patients will experience some swelling and bruising in the breast.  Ice packs and a good support bra will help.  Swelling and bruising can take several days to resolve.  It is common to experience some constipation if taking pain medication after surgery.  Increasing fluid intake and taking a stool softener will usually help or prevent this problem from occurring.  A mild laxative (Milk of Magnesia or Miralax) should be taken according to package directions if there are no bowel movements after 48 hours. Unless discharge instructions indicate otherwise, you may remove your bandages 24-48 hours after surgery, and you may shower at that time.  You may have steri-strips (small skin tapes) in place directly over the incision.  These strips should be left on the skin for 7-10 days.  If your surgeon used skin glue on the incision, you may shower in 24 hours.  The glue will flake off over the next 2-3 weeks.  Any  sutures or staples will be removed at the office during your follow-up visit. ACTIVITIES:  You may resume regular daily activities (gradually increasing) beginning the next day.  Wearing a good support bra or sports bra minimizes pain and swelling.  You may have sexual intercourse when it is comfortable. You may drive when you no longer are taking prescription pain medication, you can comfortably wear a seatbelt, and you can safely maneuver your car and apply brakes. RETURN TO WORK:  ______________________________________________________________________________________ Dennis Bast should see your doctor in the office for a follow-up appointment approximately two weeks after your surgery.  Your doctor's nurse will typically make your follow-up appointment when she calls you with your pathology report.  Expect your pathology report 2-3 business days after your surgery.  You may call to check if you do not hear from [place] after three days. OTHER INSTRUCTIONS: ok to remove the binder and shower starting tomorrow Ice pack, tylenol, and ibuprofen also for pain No vigorous activity for one week _______________________________________________________________________________________________ _____________________________________________________________________________________________________________________________________ _____________________________________________________________________________________________________________________________________ _____________________________________________________________________________________________________________________________________  WHEN TO CALL YOUR DOCTOR: Fever over 101.0 Nausea and/or vomiting. Extreme swelling or bruising. Continued bleeding from incision. Increased pain, redness, or drainage from the incision.  The clinic staff is available to answer your questions during regular business hours.  Please don't hesitate to call and ask to speak to one of the  nurses for clinical concerns.  If you have a medical emergency, go to the nearest emergency room or call 911.  A surgeon from [facility] is always on call at the hospital.  For  further questions, please visit [email or web address]     Next tylenol may be given at 1p  Post Anesthesia Home Care Instructions  Activity: Get plenty of rest for the remainder of the day. A responsible individual must stay with you for 24 hours following the procedure.  For the next 24 hours, DO NOT: -Drive a car -Paediatric nurse -Drink alcoholic beverages -Take any medication unless instructed by your physician -Make any legal decisions or sign important papers.  Meals: Start with liquid foods such as gelatin or soup. Progress to regular foods as tolerated. Avoid greasy, spicy, heavy foods. If nausea and/or vomiting occur, drink only clear liquids until the nausea and/or vomiting subsides. Call your physician if vomiting continues.  Special Instructions/Symptoms: Your throat may feel dry or sore from the anesthesia or the breathing tube placed in your throat during surgery. If this causes discomfort, gargle with warm salt water. The discomfort should disappear within 24 hours.  If you had a scopolamine patch placed behind your ear for the management of post- operative nausea and/or vomiting:  1. The medication in the patch is effective for 72 hours, after which it should be removed.  Wrap patch in a tissue and discard in the trash. Wash hands thoroughly with soap and water. 2. You may remove the patch earlier than 72 hours if you experience unpleasant side effects which may include dry mouth, dizziness or visual disturbances. 3. Avoid touching the patch. Wash your hands with soap and water after contact with the patch.

## 2022-05-22 NOTE — Transfer of Care (Signed)
Immediate Anesthesia Transfer of Care Note  Patient: Tonya Knight  Procedure(s) Performed: LEFT BREAST LUMPECTOMY WITH RADIOACTIVE SEED LOCALIZATION (Left: Breast)  Patient Location: PACU  Anesthesia Type:General  Level of Consciousness: drowsy and patient cooperative  Airway & Oxygen Therapy: Patient Spontanous Breathing and Patient connected to face mask oxygen  Post-op Assessment: Report given to RN and Post -op Vital signs reviewed and stable  Post vital signs: Reviewed and stable  Last Vitals:  Vitals Value Taken Time  BP    Temp    Pulse 84 05/22/22 0908  Resp    SpO2 91 % 05/22/22 0908  Vitals shown include unvalidated device data.  Last Pain:  Vitals:   05/22/22 0651  TempSrc: Oral  PainSc: 0-No pain      Patients Stated Pain Goal: 2 (75/43/60 6770)  Complications: No notable events documented.

## 2022-05-23 ENCOUNTER — Encounter (HOSPITAL_BASED_OUTPATIENT_CLINIC_OR_DEPARTMENT_OTHER): Payer: Self-pay | Admitting: Surgery

## 2022-05-24 LAB — SURGICAL PATHOLOGY

## 2022-06-02 DIAGNOSIS — G4733 Obstructive sleep apnea (adult) (pediatric): Secondary | ICD-10-CM | POA: Diagnosis not present

## 2022-06-04 DIAGNOSIS — J069 Acute upper respiratory infection, unspecified: Secondary | ICD-10-CM | POA: Diagnosis not present

## 2022-06-04 DIAGNOSIS — R509 Fever, unspecified: Secondary | ICD-10-CM | POA: Diagnosis not present

## 2022-06-10 DIAGNOSIS — Z1283 Encounter for screening for malignant neoplasm of skin: Secondary | ICD-10-CM | POA: Diagnosis not present

## 2022-06-10 DIAGNOSIS — D225 Melanocytic nevi of trunk: Secondary | ICD-10-CM | POA: Diagnosis not present

## 2022-06-19 ENCOUNTER — Encounter (HOSPITAL_COMMUNITY): Payer: Self-pay

## 2022-07-02 DIAGNOSIS — G4733 Obstructive sleep apnea (adult) (pediatric): Secondary | ICD-10-CM | POA: Diagnosis not present

## 2022-07-03 DIAGNOSIS — N390 Urinary tract infection, site not specified: Secondary | ICD-10-CM | POA: Diagnosis not present

## 2022-07-03 DIAGNOSIS — Z0000 Encounter for general adult medical examination without abnormal findings: Secondary | ICD-10-CM | POA: Diagnosis not present

## 2022-07-03 DIAGNOSIS — E7800 Pure hypercholesterolemia, unspecified: Secondary | ICD-10-CM | POA: Diagnosis not present

## 2022-07-10 DIAGNOSIS — G5712 Meralgia paresthetica, left lower limb: Secondary | ICD-10-CM | POA: Diagnosis not present

## 2022-07-10 DIAGNOSIS — Z0000 Encounter for general adult medical examination without abnormal findings: Secondary | ICD-10-CM | POA: Diagnosis not present

## 2022-07-10 DIAGNOSIS — F419 Anxiety disorder, unspecified: Secondary | ICD-10-CM | POA: Diagnosis not present

## 2022-07-10 DIAGNOSIS — Z23 Encounter for immunization: Secondary | ICD-10-CM | POA: Diagnosis not present

## 2022-07-10 DIAGNOSIS — M5459 Other low back pain: Secondary | ICD-10-CM | POA: Diagnosis not present

## 2022-07-17 ENCOUNTER — Ambulatory Visit: Payer: BC Managed Care – PPO | Admitting: Physician Assistant

## 2022-08-01 DIAGNOSIS — G4733 Obstructive sleep apnea (adult) (pediatric): Secondary | ICD-10-CM | POA: Diagnosis not present

## 2022-09-01 DIAGNOSIS — G4733 Obstructive sleep apnea (adult) (pediatric): Secondary | ICD-10-CM | POA: Diagnosis not present

## 2022-09-12 DIAGNOSIS — Z09 Encounter for follow-up examination after completed treatment for conditions other than malignant neoplasm: Secondary | ICD-10-CM | POA: Diagnosis not present

## 2022-09-12 DIAGNOSIS — Z86010 Personal history of colonic polyps: Secondary | ICD-10-CM | POA: Diagnosis not present

## 2022-09-12 DIAGNOSIS — K648 Other hemorrhoids: Secondary | ICD-10-CM | POA: Diagnosis not present

## 2022-09-12 DIAGNOSIS — Z9889 Other specified postprocedural states: Secondary | ICD-10-CM | POA: Diagnosis not present

## 2022-09-12 DIAGNOSIS — K5730 Diverticulosis of large intestine without perforation or abscess without bleeding: Secondary | ICD-10-CM | POA: Diagnosis not present

## 2022-09-30 DIAGNOSIS — G4733 Obstructive sleep apnea (adult) (pediatric): Secondary | ICD-10-CM | POA: Diagnosis not present

## 2022-10-02 DIAGNOSIS — G4733 Obstructive sleep apnea (adult) (pediatric): Secondary | ICD-10-CM | POA: Diagnosis not present

## 2022-10-31 DIAGNOSIS — G4733 Obstructive sleep apnea (adult) (pediatric): Secondary | ICD-10-CM | POA: Diagnosis not present

## 2022-12-01 DIAGNOSIS — G4733 Obstructive sleep apnea (adult) (pediatric): Secondary | ICD-10-CM | POA: Diagnosis not present

## 2023-01-08 DIAGNOSIS — I2510 Atherosclerotic heart disease of native coronary artery without angina pectoris: Secondary | ICD-10-CM | POA: Diagnosis not present

## 2023-02-17 DIAGNOSIS — G4733 Obstructive sleep apnea (adult) (pediatric): Secondary | ICD-10-CM | POA: Diagnosis not present

## 2023-02-21 DIAGNOSIS — F419 Anxiety disorder, unspecified: Secondary | ICD-10-CM | POA: Diagnosis not present

## 2023-03-06 DIAGNOSIS — F419 Anxiety disorder, unspecified: Secondary | ICD-10-CM | POA: Diagnosis not present

## 2023-03-20 DIAGNOSIS — G4733 Obstructive sleep apnea (adult) (pediatric): Secondary | ICD-10-CM | POA: Diagnosis not present

## 2023-03-20 DIAGNOSIS — F419 Anxiety disorder, unspecified: Secondary | ICD-10-CM | POA: Diagnosis not present

## 2023-03-24 ENCOUNTER — Telehealth: Payer: Self-pay

## 2023-03-24 NOTE — Progress Notes (Unsigned)
PATIENT: Tonya Knight DOB: 1971-07-29  REASON FOR VISIT: follow up HISTORY FROM: patient  Virtual Visit via Telephone Note  I connected with Tonya Knight on 03/25/23 at  8:30 AM EDT by telephone and verified that I am speaking with the correct person using two identifiers.   I discussed the limitations, risks, security and privacy concerns of performing an evaluation and management service by telephone and the availability of in person appointments. I also discussed with the patient that there may be a patient responsible charge related to this service. The patient expressed understanding and agreed to proceed.   History of Present Illness:  03/25/23 ALL: Tonya Knight is a 52 y.o. female here today for follow up for OSA on CPAP.  She continues to do well. She is using CPAP nightly for about 9 hours. She does feel better rested and more energized when using therapy. She denies concerns with machine or supplies.     03/13/22 ALL: Tonya Knight returns for follow up for OSA on CPAP. She continues to do well on therapy. She is using CPAP nightly for about 8-9 hours. She denies any concerns with her machine. She does have some skin irritation around her nose from her nasal mask. She has used antibiotic ointment but reports irritation lingers for days/weeks. She has not contacted DME.      Observations/Objective:  Generalized: Well developed, in no acute distress  Mentation: Alert oriented to time, place, history taking. Follows all commands speech and language fluent   Assessment and Plan:  52 y.o. year old female  has a past medical history of Anxiety, Depression, Hyperlipidemia, Left breast mass, and Sleep apnea. here with    ICD-10-CM   1. OSA on CPAP  G47.33 For home use only DME continuous positive airway pressure (CPAP)     Tonya Knight continues to do well with CPAP therapy. Compliance report reveals excellent compliance. She was encouraged to  continue CPAP nightly for at least 4 hours. Supply orders updated. Healthy lifestyle habits encouraged. She will follow up in 1 year, sooner if needed.   Orders Placed This Encounter  Procedures   For home use only DME continuous positive airway pressure (CPAP)    Supplies    Order Specific Question:   Length of Need    Answer:   Lifetime    Order Specific Question:   Patient has OSA or probable OSA    Answer:   Yes    Order Specific Question:   Is the patient currently using CPAP in the home    Answer:   Yes    Order Specific Question:   Settings    Answer:   Other see comments    Order Specific Question:   CPAP supplies needed    Answer:   Mask, headgear, cushions, filters, heated tubing and water chamber    No orders of the defined types were placed in this encounter.    Follow Up Instructions:  I discussed the assessment and treatment plan with the patient. The patient was provided an opportunity to ask questions and all were answered. The patient agreed with the plan and demonstrated an understanding of the instructions.   The patient was advised to call back or seek an in-person evaluation if the symptoms worsen or if the condition fails to improve as anticipated.  I provided 15 minutes of non-face-to-face time during this encounter. Patient located at their place of residence during Mychart visit. Provider is in the office.  Shawnie Dapper, NP

## 2023-03-24 NOTE — Patient Instructions (Signed)

## 2023-03-25 ENCOUNTER — Encounter: Payer: Self-pay | Admitting: Family Medicine

## 2023-03-25 ENCOUNTER — Telehealth (INDEPENDENT_AMBULATORY_CARE_PROVIDER_SITE_OTHER): Payer: BC Managed Care – PPO | Admitting: Family Medicine

## 2023-03-25 DIAGNOSIS — G4733 Obstructive sleep apnea (adult) (pediatric): Secondary | ICD-10-CM

## 2023-04-14 ENCOUNTER — Other Ambulatory Visit: Payer: Self-pay | Admitting: Obstetrics and Gynecology

## 2023-04-14 DIAGNOSIS — Z6841 Body Mass Index (BMI) 40.0 and over, adult: Secondary | ICD-10-CM | POA: Diagnosis not present

## 2023-04-14 DIAGNOSIS — Z01419 Encounter for gynecological examination (general) (routine) without abnormal findings: Secondary | ICD-10-CM | POA: Diagnosis not present

## 2023-04-14 DIAGNOSIS — Z1231 Encounter for screening mammogram for malignant neoplasm of breast: Secondary | ICD-10-CM

## 2023-04-16 DIAGNOSIS — F419 Anxiety disorder, unspecified: Secondary | ICD-10-CM | POA: Diagnosis not present

## 2023-04-19 DIAGNOSIS — G4733 Obstructive sleep apnea (adult) (pediatric): Secondary | ICD-10-CM | POA: Diagnosis not present

## 2023-04-21 ENCOUNTER — Ambulatory Visit
Admission: RE | Admit: 2023-04-21 | Discharge: 2023-04-21 | Disposition: A | Discharge status: Home / Self Care | Payer: BC Managed Care – PPO | Source: Ambulatory Visit | Attending: Obstetrics and Gynecology | Admitting: Obstetrics and Gynecology

## 2023-04-21 DIAGNOSIS — Z1231 Encounter for screening mammogram for malignant neoplasm of breast: Secondary | ICD-10-CM | POA: Diagnosis not present

## 2023-04-23 ENCOUNTER — Other Ambulatory Visit: Payer: Self-pay | Admitting: Obstetrics and Gynecology

## 2023-04-23 DIAGNOSIS — N6489 Other specified disorders of breast: Secondary | ICD-10-CM

## 2023-04-23 DIAGNOSIS — R928 Other abnormal and inconclusive findings on diagnostic imaging of breast: Secondary | ICD-10-CM

## 2023-04-28 ENCOUNTER — Ambulatory Visit: Admission: RE | Admit: 2023-04-28 | Payer: BC Managed Care – PPO | Source: Ambulatory Visit

## 2023-04-28 ENCOUNTER — Ambulatory Visit: Payer: BC Managed Care – PPO

## 2023-04-28 DIAGNOSIS — N6489 Other specified disorders of breast: Secondary | ICD-10-CM

## 2023-04-28 DIAGNOSIS — R928 Other abnormal and inconclusive findings on diagnostic imaging of breast: Secondary | ICD-10-CM | POA: Diagnosis not present

## 2023-06-03 DIAGNOSIS — G4733 Obstructive sleep apnea (adult) (pediatric): Secondary | ICD-10-CM | POA: Diagnosis not present

## 2023-07-04 DIAGNOSIS — G4733 Obstructive sleep apnea (adult) (pediatric): Secondary | ICD-10-CM | POA: Diagnosis not present

## 2023-07-14 DIAGNOSIS — I2510 Atherosclerotic heart disease of native coronary artery without angina pectoris: Secondary | ICD-10-CM | POA: Diagnosis not present

## 2023-07-14 DIAGNOSIS — Z0000 Encounter for general adult medical examination without abnormal findings: Secondary | ICD-10-CM | POA: Diagnosis not present

## 2023-07-14 DIAGNOSIS — E559 Vitamin D deficiency, unspecified: Secondary | ICD-10-CM | POA: Diagnosis not present

## 2023-07-29 DIAGNOSIS — R682 Dry mouth, unspecified: Secondary | ICD-10-CM | POA: Diagnosis not present

## 2023-07-29 DIAGNOSIS — Z0000 Encounter for general adult medical examination without abnormal findings: Secondary | ICD-10-CM | POA: Diagnosis not present

## 2023-08-03 DIAGNOSIS — G4733 Obstructive sleep apnea (adult) (pediatric): Secondary | ICD-10-CM | POA: Diagnosis not present

## 2023-08-11 DIAGNOSIS — L304 Erythema intertrigo: Secondary | ICD-10-CM | POA: Diagnosis not present

## 2023-08-25 DIAGNOSIS — D225 Melanocytic nevi of trunk: Secondary | ICD-10-CM | POA: Diagnosis not present

## 2023-08-25 DIAGNOSIS — D2239 Melanocytic nevi of other parts of face: Secondary | ICD-10-CM | POA: Diagnosis not present

## 2023-08-25 DIAGNOSIS — L821 Other seborrheic keratosis: Secondary | ICD-10-CM | POA: Diagnosis not present

## 2023-08-25 DIAGNOSIS — D2272 Melanocytic nevi of left lower limb, including hip: Secondary | ICD-10-CM | POA: Diagnosis not present

## 2023-09-01 DIAGNOSIS — G4733 Obstructive sleep apnea (adult) (pediatric): Secondary | ICD-10-CM | POA: Diagnosis not present

## 2023-09-03 DIAGNOSIS — G4733 Obstructive sleep apnea (adult) (pediatric): Secondary | ICD-10-CM | POA: Diagnosis not present

## 2024-02-10 ENCOUNTER — Other Ambulatory Visit: Payer: Self-pay | Admitting: Obstetrics and Gynecology

## 2024-02-10 DIAGNOSIS — Z1231 Encounter for screening mammogram for malignant neoplasm of breast: Secondary | ICD-10-CM

## 2024-03-26 NOTE — Progress Notes (Unsigned)
 PATIENT: Tonya Knight DOB: 07/31/1971  REASON FOR VISIT: follow up HISTORY FROM: patient  Virtual Visit via Mychart video Note  I connected with Tonya  Amanda Knight on 03/30/24 at  8:00 AM EDT by Mychart video and verified that I am speaking with the correct person using two identifiers.   I discussed the limitations, risks, security and privacy concerns of performing an evaluation and management service by Mychart video and the availability of in person appointments. I also discussed with the patient that there may be a patient responsible charge related to this service. The patient expressed understanding and agreed to proceed.   History of Present Illness:  03/30/24 ALL: [name]  returns for follow up for OSA on CPAP. She continues to do well. She is using CPAP nightly for about 8 hours, on average. She denies concerns with machine or supplies. She continues to use a nasal pillow style mask. Machine was set up in 2020. ESS 6/24.     03/25/2023 ALL:  Tonya  Amanda Knight is a 53 y.o. female here today for follow up for OSA on CPAP.  She continues to do well. She is using CPAP nightly for about 9 hours. She does feel better rested and more energized when using therapy. She denies concerns with machine or supplies.     03/13/22 ALL: Tonya Knight returns for follow up for OSA on CPAP. She continues to do well on therapy. She is using CPAP nightly for about 8-9 hours. She denies any concerns with her machine. She does have some skin irritation around her nose from her nasal mask. She has used antibiotic ointment but reports irritation lingers for days/weeks. She has not contacted DME.      Observations/Objective:  Generalized: Well developed, in no acute distress  Mentation: Alert oriented to time, place, history taking. Follows all commands speech and language fluent   Assessment and Plan:  53 y.o. year old female  has a past medical history of Anxiety,  Depression, Hyperlipidemia, Left breast mass, and Sleep apnea. here with    ICD-10-CM   1. OSA on CPAP  G47.33 Home sleep test    For home use only DME continuous positive airway pressure (CPAP)      Tonya Knight continues to do well with CPAP therapy. Compliance report reveals excellent compliance. She was encouraged to continue CPAP nightly for at least 4 hours. Supply orders updated. We will place order for repeat HST to establish new baseline. I will order new CPAP pending results. Healthy lifestyle habits encouraged. She will follow up in 31-90 days following set up of new machine.    Orders Placed This Encounter  Procedures   For home use only DME continuous positive airway pressure (CPAP)    Heated Humidity with all supplies as needed    Length of Need:   Lifetime    Patient has OSA or probable OSA:   Yes    Is the patient currently using CPAP in the home:   Yes    Settings:   Other see comments    CPAP supplies needed:   Mask, headgear, cushions, filters, heated tubing and water chamber   Home sleep test    Standing Status:   Future    Expiration Date:   03/26/2025    Where should this test be performed::   [facility]    No orders of the defined types were placed in this encounter.    Follow Up Instructions:  I discussed the assessment  and treatment plan with the patient. The patient was provided an opportunity to ask questions and all were answered. The patient agreed with the plan and demonstrated an understanding of the instructions.   The patient was advised to call back or seek an in-person evaluation if the symptoms worsen or if the condition fails to improve as anticipated.  I provided 15 minutes of non-face-to-face time during this encounter. Patient located at their place of residence during Mychart visit. Provider is in the office.    Pasco Marchitto, NP

## 2024-03-26 NOTE — Patient Instructions (Incomplete)

## 2024-03-29 ENCOUNTER — Encounter: Payer: Self-pay | Admitting: *Deleted

## 2024-03-30 ENCOUNTER — Encounter: Payer: Self-pay | Admitting: Family Medicine

## 2024-03-30 ENCOUNTER — Telehealth: Payer: BC Managed Care – PPO | Admitting: Family Medicine

## 2024-03-30 DIAGNOSIS — G4733 Obstructive sleep apnea (adult) (pediatric): Secondary | ICD-10-CM | POA: Diagnosis not present

## 2024-04-12 ENCOUNTER — Encounter (INDEPENDENT_AMBULATORY_CARE_PROVIDER_SITE_OTHER): Payer: Self-pay

## 2024-04-14 ENCOUNTER — Other Ambulatory Visit: Payer: Self-pay | Admitting: Medical Genetics

## 2024-04-15 ENCOUNTER — Other Ambulatory Visit (HOSPITAL_COMMUNITY)

## 2024-04-20 ENCOUNTER — Other Ambulatory Visit (HOSPITAL_COMMUNITY)

## 2024-04-20 ENCOUNTER — Ambulatory Visit (INDEPENDENT_AMBULATORY_CARE_PROVIDER_SITE_OTHER): Admitting: Neurology

## 2024-04-20 DIAGNOSIS — G4733 Obstructive sleep apnea (adult) (pediatric): Secondary | ICD-10-CM

## 2024-04-20 DIAGNOSIS — Z6841 Body Mass Index (BMI) 40.0 and over, adult: Secondary | ICD-10-CM

## 2024-04-21 ENCOUNTER — Ambulatory Visit
Admission: RE | Admit: 2024-04-21 | Discharge: 2024-04-21 | Disposition: A | Discharge status: Home / Self Care | Source: Ambulatory Visit | Attending: Obstetrics and Gynecology | Admitting: Obstetrics and Gynecology

## 2024-04-21 DIAGNOSIS — Z1231 Encounter for screening mammogram for malignant neoplasm of breast: Secondary | ICD-10-CM

## 2024-04-21 NOTE — Progress Notes (Signed)
 [facility]  Tonya Knight  Tonya Knight Tonya Knight 53 year old female 05/23/1971   HOME SLEEP TEST REPORT ( by Watch PAT)   STUDY DATE:  04-20-2024   ORDERING CLINICIAN: Amy Lomax, NP/  Dedra Gores, MD  REFERRING CLINICIANBETHA Marie FNP   CLINICAL INFORMATION/HISTORY: seen by [name] on 03-30-2024:   This patient is followed for sleep apnea in the setting of obesity with a BMI of 47. Neck size 18  ,on CPAP for OSA,  very compliant and with good control of AHI - residual AHI was 0.4/h on a setting of 6-16 cm pressure , EPR of 1.      Epworth sleepiness score: 8/24.   BMI:47 kg/m   Neck Circumference: 18   FINDINGS:   Sleep Summary:   Total Recording Time (hours, min):     8 hours 8 minutes   Total Sleep Time (hours, min):    7 hours 15 minutes             Percent REM (%): circa 24%                                      Respiratory Indices:   Calculated pAHI (per AASM guideline): The total AHI for this patient following AASM criteria was 42.2/h and places her into the severe apnea category.                      REM pAHI:       57/h                                          NREM pAHI: 37.7/h                            Positional AHI/ Snoring:    There were neither snoring data nor positional data available.   Both functions are conducted through a chest wall electrodes that seem to have failed.                                         Oxygen Saturation Statistics:   Oxygen Saturation (%) Mean:       93%         O2 Saturation Range (%):     86% through 98%                                  O2 Saturation (minutes) <89%: 1 minute         Pulse Rate Statistics:   Pulse Mean (bpm):    90 bpm             Pulse Range:    Between 67 and 121 bpm.             IMPRESSION:  This HST confirms the presence of severe sleep apnea but the device did not indicate if all apnea was obstructive.    RECOMMENDATION: Given the excellent resolution of apnea as  seen on previous downloads from her current CPAP machine I would  recommend to continue treatment by the same means. 6-16 cm water pressure 1 cm EPR and mask of patients choice and comfort.    Any patient should be cautioned not to drive, work at heights, or operate dangerous or heavy equipment when tired or sleepy.   Review of good sleep hygiene measures is accessible to any sleep clinic patient and can be reiterated through online material- I we recommend the Guide to better Sleep   by the NIH.   Weight loss and Core Strength improvement is recommended for individuals with low muscle tone and/ or a BMI over 32.  The referring physician will be notified of the test results.       INTERPRETING PHYSICIAN:   Dedra Gores, MD  [facility] and [facility] Sleep Board certified by The [employer] of Sleep Medicine and Diplomate of the [employer] of Sleep Medicine. Board certified In Neurology through the ABPN, Fellow of the [employer] of Neu[employer].

## 2024-04-23 ENCOUNTER — Other Ambulatory Visit (HOSPITAL_COMMUNITY)
Admission: RE | Admit: 2024-04-23 | Discharge: 2024-04-23 | Disposition: A | Discharge status: Home / Self Care | Payer: Self-pay | Source: Ambulatory Visit | Attending: Medical Genetics | Admitting: Medical Genetics

## 2024-04-27 NOTE — Procedures (Signed)
 [facility]  Tonya Knight  Tonya Knight 53 year old female [birth year]   HOME SLEEP TEST REPORT ( by Watch PAT)   STUDY DATE:  04-20-2024   ORDERING CLINICIAN: Amy Lomax, NP/  Dedra Gores, MD  REFERRING CLINICIANBETHA Marie FNP   CLINICAL INFORMATION/HISTORY: seen by [name] on 03-30-2024:   This patient is followed for sleep apnea in the setting of obesity with a BMI of 47. Neck size 18  ,on CPAP for OSA,  very compliant and with good control of AHI - residual AHI was 0.4/h on a setting of 6-16 cm pressure , EPR of 1.      Epworth sleepiness score: 8/24.   BMI:47 kg/m   Neck Circumference: 18   FINDINGS:   Sleep Summary:   Total Recording Time (hours, min):     8 hours 8 minutes   Total Sleep Time (hours, min):    7 hours 15 minutes             Percent REM (%): circa 24%                                      Respiratory Indices:   Calculated pAHI (per AASM guideline): The total AHI for this patient following AASM criteria was 42.2/h and places her into the severe apnea category.                      REM pAHI:       57/h                                          NREM pAHI: 37.7/h                            Positional AHI/ Snoring:    There were neither snoring data nor positional data available.   Both functions are conducted through a chest wall electrodes that seem to have failed.                                         Oxygen Saturation Statistics:   Oxygen Saturation (%) Mean:       93%         O2 Saturation Range (%):     86% through 98%                                  O2 Saturation (minutes) <89%: 1 minute         Pulse Rate Statistics:   Pulse Mean (bpm):    90 bpm             Pulse Range:    Between 67 and 121 bpm.             IMPRESSION:  This HST confirms the presence of severe sleep apnea but the device did not indicate if all apnea was obstructive.    RECOMMENDATION: Given the excellent resolution of apnea as seen on  previous downloads from her current CPAP machine I would recommend to continue treatment  by the same means. 6-16 cm water pressure 1 cm EPR and mask of patients choice and comfort.    Any patient should be cautioned not to drive, work at heights, or operate dangerous or heavy equipment when tired or sleepy.   Review of good sleep hygiene measures is accessible to any sleep clinic patient and can be reiterated through online material- I we recommend the Guide to better Sleep   by the NIH.   Weight loss and Core Strength improvement is recommended for individuals with low muscle tone and/ or a BMI over 32.  The referring physician will be notified of the test results.       INTERPRETING PHYSICIAN:   Dedra Gores, MD  [facility] and [facility] Sleep Board certified by The [employer] of Sleep Medicine and Diplomate of the [employer] of Sleep Medicine. Board certified In Neurology through the ABPN, Fellow of the [employer] of Neu[employer].

## 2024-04-28 ENCOUNTER — Ambulatory Visit: Payer: Self-pay | Admitting: Family Medicine

## 2024-04-28 DIAGNOSIS — G4733 Obstructive sleep apnea (adult) (pediatric): Secondary | ICD-10-CM

## 2024-05-03 LAB — GENECONNECT MOLECULAR SCREEN: Genetic Analysis Overall Interpretation: NEGATIVE

## 2024-07-02 ENCOUNTER — Ambulatory Visit (INDEPENDENT_AMBULATORY_CARE_PROVIDER_SITE_OTHER): Admitting: Otolaryngology

## 2024-07-02 ENCOUNTER — Ambulatory Visit (INDEPENDENT_AMBULATORY_CARE_PROVIDER_SITE_OTHER): Admitting: Audiology

## 2024-07-02 VITALS — BP 120/77 | HR 92 | Ht 64.0 in | Wt 275.0 lb

## 2024-07-02 DIAGNOSIS — H903 Sensorineural hearing loss, bilateral: Secondary | ICD-10-CM

## 2024-07-02 DIAGNOSIS — Z0110 Encounter for examination of ears and hearing without abnormal findings: Secondary | ICD-10-CM

## 2024-07-02 DIAGNOSIS — H93293 Other abnormal auditory perceptions, bilateral: Secondary | ICD-10-CM

## 2024-07-02 NOTE — Progress Notes (Signed)
  [address], [address] [place], [place] [address] [phone]  Audiological Evaluation    Name: Tonya Knight     DOB:   1971-02-25      MRN:   [id number]                                                                                     Service Date: 07/02/2024     Accompanied by: unaccompanied   Patient comes today after Dr. Karis, ENT sent a referral for a hearing evaluation due to concerns with hearing loss.   Symptoms Yes Details  Hearing loss  [x]  hearing loss in both ears, per family  Tinnitus  [x]  both ears, constant  Ear pain/ infections/pressure  []    Balance problems  []    Noise exposure history  [x]  concerts  Previous ear surgeries  [x]  tubes as an adult in one ear  Family history of hearing loss  []     Amplification  []    Other  []      Otoscopy: Right ear: Clear external ear canal and notable landmarks visualized on the tympanic membrane. Left ear:  Clear external ear canal and notable landmarks visualized on the tympanic membrane.  Tympanometry: Right ear: Type A- Normal external ear canal volume with normal middle ear pressure and tympanic membrane compliance. Left ear: Type A- Normal external ear canal volume with normal middle ear pressure and tympanic membrane compliance.  Pure tone Audiometry: Right ear- Borderline normal to moderate sensorineural hearing loss from 125 Hz - 8000 Hz. Left ear-  Mild to moderate sensorineural hearing loss from 125 Hz - 8000 Hz.  Speech Audiometry: Right ear- Speech Reception Threshold (SRT) was obtained at 35 dBHL. Left ear-Speech Reception Threshold (SRT) was obtained at 25 dBHL.   Word Recognition Score Tested using NU-6 (recorded) Right ear: 100% was obtained at a presentation level of 75 dBHL with contralateral masking which is deemed as  excellent. Left ear: 100% was obtained at a presentation level of 65 dBHL with contralateral masking which is deemed as  excellent.   The hearing test results  were completed under headphones and re-checked with inserts and results are deemed to be of good reliability. 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   Recommendations: Follow up with ENT as scheduled for today. Return for a hearing evaluation if concerns with hearing changes arise or per MD recommendation. Consider a communication needs assessment after medical clearance for hearing aids is obtained.   Rachit Grim MARIE LEROUX-MARTINEZ, AUD

## 2024-07-02 NOTE — Progress Notes (Signed)
 CC: Hearing loss  HPI:  Tonya Knight  Tonya Knight is a 53 y.o. female who presents today complaining of hearing loss for the past 6 months.  She has noted increasing hearing difficulty, especially in noisy environments.  She underwent left myringotomy and tube placement 15 years ago by Dr. Norleen Knight.  The tube has since extruded.  She has not noted any recent otitis media or otitis externa.  Currently she denies any otalgia, otorrhea, or vertigo.  Past Medical History:  Diagnosis Date   Anxiety    Depression    Hyperlipidemia    Left breast mass    Sleep apnea    uses CPAP nightly    Past Surgical History:  Procedure Laterality Date   BREAST BIOPSY Left 03/2022   BREAST EXCISIONAL BIOPSY Left 05/2022   BREAST LUMPECTOMY WITH RADIOACTIVE SEED LOCALIZATION Left 05/22/2022   Procedure: LEFT BREAST LUMPECTOMY WITH RADIOACTIVE SEED LOCALIZATION;  Surgeon: Tonya Berg, MD;  Location: [facility];  Service: General;  Laterality: Left;  LMA   COLONOSCOPY     LASIK     left foot surgery Left 04/13/2019   patient reported    Family History  Problem Relation Age of Onset   Hyperlipidemia Mother    Hyperlipidemia Father    Breast cancer Paternal Aunt    Heart attack Maternal Grandfather    Cancer Paternal Grandfather     Social History:  reports that she has never smoked. She has never used smokeless tobacco. She reports current alcohol use. She reports that she does not use drugs.  Allergies: No Known Allergies  Prior to Admission medications   Medication Sig Start Date End Date Taking? Authorizing Provider  clonazePAM (KLONOPIN) 0.5 MG tablet Take 2 mg by mouth daily.    Yes [provider]  cyclobenzaprine (FLEXERIL) 10 MG tablet Take 10 mg by mouth as needed for muscle spasms.    Yes [provider]  escitalopram (LEXAPRO) 20 MG tablet Take 20 mg by mouth daily.   Yes [provider]  Multiple Vitamin (MULTIVITAMIN WITH MINERALS)  TABS tablet Take 1 tablet by mouth daily.   Yes [provider]  simvastatin (ZOCOR) 10 MG tablet Take 10 mg by mouth daily.   Yes [provider]  ARIPiprazole (ABILIFY) 5 MG tablet Take 5 mg by mouth daily. 08/21/19   [provider]  Cholecalciferol (VITAMIN D) 50 MCG (2000 UT) tablet Take 2,000 Units by mouth daily.     [provider]  estradiol (CLIMARA - DOSED IN MG/24 HR) 0.0375 mg/24hr patch Place 1 patch onto the skin once a week. 01/19/22   [provider]  HYDROcodone -acetaminophen  (NORCO/VICODIN) 5-325 MG tablet Take 1 tablet by mouth every 6 (six) hours as needed for moderate pain or severe pain. 05/22/22   Tonya Berg, MD  Melatonin 5 MG CAPS Take 1 capsule by mouth at bedtime as needed.    [provider]  meloxicam (MOBIC) 15 MG tablet Take 15 mg by mouth daily.    [provider]  mupirocin  ointment (BACTROBAN ) 2 % Apply 1 application topically 2 (two) times daily. 07/17/21   Sheffield, Kelli R, PA-C  Turmeric 500 MG CAPS Take by mouth.    [provider]    Blood pressure 120/77, pulse 92, height 5' 4 (1.626 m), weight 275 lb (124.7 kg), SpO2 97%. Exam: General: Communicates without difficulty, well nourished, no acute distress. Head: Normocephalic, no evidence injury, no tenderness, facial buttresses intact without stepoff. Face/sinus:  No tenderness to palpation and percussion. Facial movement is normal and symmetric. Eyes: PERRL, EOMI. No scleral icterus, conjunctivae clear. Neuro: CN II exam reveals vision grossly intact.  No nystagmus at any point of gaze. Ears: Auricles well formed without lesions.  Ear canals are intact without mass or lesion.  No erythema or edema is appreciated.  The TMs are intact without fluid. Nose: External evaluation reveals normal support and skin without lesions.  Dorsum is intact.  Anterior rhinoscopy reveals normal mucosa over anterior aspect of inferior turbinates and intact  septum.  No purulence noted. Oral:  Oral cavity and oropharynx are intact, symmetric, without erythema or edema.  Mucosa is moist without lesions. Neck: Full range of motion without pain.  There is no significant lymphadenopathy.  No masses palpable.  Thyroid  bed within normal limits to palpation.  Parotid glands and submandibular glands equal bilaterally without mass.  Trachea is midline. Neuro:  CN 2-12 grossly intact.   Her hearing test shows bilateral high-frequency sensorineural hearing loss, worse on the right side.  Assessment: 1.  Bilateral high-frequency sensorineural hearing loss, worse on the right side. 2.  Her ear canals, tympanic membranes, and middle ear spaces are all normal.  Plan: 1.  The physical exam findings and the hearing test results are reviewed with the patient. 2.  The small possibility of a retrocochlear lesion causing the asymmetric hearing loss is discussed.  The options of conservative observation versus MRI scan are reviewed.  The patient would like to proceed with conservative observation.  3.  The patient is a candidate for hearing amplification.  The hearing aid options are discussed. 4.  The patient will return for reevaluation in 6 months, with repeat hearing test  Tonya Knight W Tonya Knight 07/02/2024, 9:16 AM

## 2024-07-02 NOTE — Progress Notes (Deleted)
  [address] [place], [place] [address] [phone]  Audiological Evaluation    Name: Tonya Knight     DOB:   Apr 02, 1971      MRN:   [id number]                                                                                     Service Date: 07/02/2024     Accompanied by: unaccompanied   Patient comes today after Dr. Karis, ENT sent a referral for a hearing evaluation due to concerns with Eustachian tube dysfunction.   Symptoms Yes Details  Hearing loss  [x]  Muffled, worse in the right ear  Tinnitus  []    Ear pain/ infections/pressure  [x]  Feels needs to pop her ears, worse in the right ear; also, reports pain more in the right ear  Balance problems  []    Noise exposure history  []    Previous ear surgeries  []    Family history of hearing loss  [x]  Mother and brother with age  Amplification  []    Other  []      Otoscopy: Right ear: Clear external ear canal and notable landmarks visualized on the tympanic membrane. Left ear:  Clear external ear canal and notable landmarks visualized on the tympanic membrane.  Tympanometry: Right ear: Type A- Normal external ear canal volume with normal middle ear pressure and tympanic membrane compliance. Left ear: Type A- Normal external ear canal volume with normal middle ear pressure and tympanic membrane compliance.    Pure tone Audiometry: Both ears; Normal hearing from 125 Hz - 8000 Hz.  Speech Audiometry: Right ear- Speech Reception Threshold (SRT) was obtained at 15 dBHL. Left ear-Speech Reception Threshold (SRT) was obtained at 15 dBHL.   Word Recognition Score Tested using NU-6 (recorded) Right ear: 100% was obtained at a presentation level of 55 dBHL with contralateral masking which is deemed as  excellent. Left ear: 100% was obtained at a presentation level of 55 dBHL with contralateral masking which is deemed as  excellent.   The hearing test results were completed under headphones and results are  deemed to be of good reliability. Test technique:  conventional    Impression: There is not a significant difference in pure-tone thresholds between ears. There is not a significant difference in the word recognition score in between ears.    Recommendations: Follow up with ENT as scheduled for today. Return for a hearing evaluation if concerns with hearing changes arise or per MD recommendation.   Kristinia Leavy MARIE LEROUX-MARTINEZ, AUD

## 2024-07-19 ENCOUNTER — Encounter: Payer: Self-pay | Admitting: Family Medicine

## 2024-10-26 ENCOUNTER — Encounter: Payer: Self-pay | Admitting: Family Medicine

## 2024-10-26 ENCOUNTER — Telehealth: Payer: Self-pay

## 2024-10-26 NOTE — Telephone Encounter (Signed)
 Printed off pt's sleep study and placed it at the front office for pt to pick up 10/26/24.

## 2024-11-18 ENCOUNTER — Ambulatory Visit: Admitting: Podiatry

## 2024-11-18 ENCOUNTER — Encounter: Payer: Self-pay | Admitting: Podiatry

## 2024-11-18 DIAGNOSIS — L600 Ingrowing nail: Secondary | ICD-10-CM

## 2024-11-18 MED ORDER — MUPIROCIN 2 % EX OINT: 1.0000 | TOPICAL_OINTMENT | Freq: Two times a day (BID) | CUTANEOUS | 2 refills | Status: DC

## 2024-11-18 MED ORDER — MUPIROCIN 2 % EX OINT: 1.0000 | TOPICAL_OINTMENT | Freq: Two times a day (BID) | CUTANEOUS | 2 refills | Status: AC

## 2024-11-18 MED ORDER — CEPHALEXIN 500 MG PO CAPS: 500.0000 mg | ORAL_CAPSULE | Freq: Three times a day (TID) | ORAL | 0 refills | Status: AC

## 2024-11-18 NOTE — Patient Instructions (Signed)
 Place 1/4 cup of epsom salts in a quart of warm tap water.  Submerge your foot or feet in the solution and soak for 20 minutes.  This soak should be done twice a day.  Next, remove your foot or feet from solution, blot dry the affected area. Apply ointment and cover if instructed by your doctor.   Caution for Individuals with Diabetes: Before soaking, always check the water temperature with your hand or a thermometer. Diabetes can reduce sensitivity to heat, increasing the risk of burns. Use only warm, not hot water and discontinue use immediately if you experience discomfort or skin irritation.  IF YOUR SKIN BECOMES IRRITATED WHILE USING THESE INSTRUCTIONS, IT IS OKAY TO SWITCH TO  WHITE VINEGAR AND WATER.  As another alternative soak, you may use antibacterial soap and water.  Monitor for any signs/symptoms of infection. Call the office immediately if any occur or go directly to the emergency room. Call with any questions/concerns.  You can use a small amount of mupirocin  antibacterial ointment on the ingrown toenail sites.  After the first 5 to 7 days decrease or discontinue using the ointment as this can keep the sites too moist.  After that time, continue to soak and bandage the toe until a dry scab starts to form.

## 2024-11-18 NOTE — Progress Notes (Signed)
 "      Subjective:  Patient ID: Tonya Knight  Tonya Knight, female    DOB: 02-04-1971,  MRN: [id number]  Tonya Knight  Tonya Knight presents to clinic today for:  Chief Complaint  Patient presents with   Toe Pain    Left great toe underneath nail is tender and red x 4 weeks. finished antibiotics today and does not look any different. No incident. Not diabetic.  no anti coag.  Patient presenting for above-stated complaint.  Reporting tenderness to the left first toenail to both sides the nail plate.  Has been ongoing for about 4 weeks.  She states that she just completed a 2-week course of oral doxycycline.  Endorses some mild redness at this point, states that swelling and puffiness about the toe appear somewhat improved.  PCP is Royden Ronal Czar, FNP.  Allergies[1]  Review of Systems: Negative except as noted in the HPI.  Objective:  There were no vitals filed for this visit.  Tonya Knight  Tonya Knight is a pleasant 54 y.o. female in NAD. AAO x 3.  Vascular Examination: Capillary refill time is less than 3 seconds to toes bilateral. Palpable pedal pulses b/l LE. Digital hair present b/l. No pedal edema b/l. Skin temperature gradient WNL b/l. No varicosities b/l. No cyanosis or clubbing noted b/l.   Dermatological Examination: There is incurvation of the left first toe bilateral nail border.  There is pain on palpation of the affected nail border.  Some localized inflammation present.  Mild erythema present.  No drainage.  Neurological Examination: Protective sensation intact with Semmes-Weinstein 10 gram monofilament b/l LE. Vibratory sensation intact b/l LE.      No data to display           Assessment/Plan: 1. Ingrown toenail of left foot with infection     Meds ordered this encounter  Medications   DISCONTD: mupirocin  ointment (BACTROBAN ) 2 %    Sig: Apply 1 Application topically 2 (two) times daily.    Dispense:  30 g    Refill:  2   DISCONTD: mupirocin   ointment (BACTROBAN ) 2 %    Sig: Apply 1 Application topically 2 (two) times daily.    Dispense:  30 g    Refill:  2   cephALEXin  (KEFLEX ) 500 MG capsule    Sig: Take 1 capsule (500 mg total) by mouth 3 (three) times daily for 7 days.    Dispense:  21 capsule    Refill:  0   mupirocin  ointment (BACTROBAN ) 2 %    Sig: Apply 1 Application topically 2 (two) times daily.    Dispense:  30 g    Refill:  2  # Ingrown toenail left foot with infection -Findings reviewed with patient.  - Discussed proceeding with ingrown toenail removal of the left first toe medial lateral borders with chemical matrixectomy -Risk, benefits alternative therapies discussed at length.  Written consent obtained -Aftercare instructions discussed at length with written instructions dispensed.  Begin soaking toe in Epsom salts for 15 to 20 minutes at a time twice a day and covering toe with bandage until dry scab forms - Given some residual redness prescribing 7 days of p.o. Keflex  out of abundance of caution, she states that the appearance of the toe is relatively unchanged after completing doxycycline. -Prescription sent in for mupirocin  for aftercare of the ingrown toenail site for the first 7 days.  Procedure: Ingrown toenail removal left first toe medial lateral nail borders with chemical matrixectomy Discussed patient's condition today.  After obtaining  patient consent, the left first toe was anesthetized with a 50:50 mixture of 1% lidocaine  plain and 0.5% bupivacaine  plain for a total of 3cc's administered.  Betadine skin prep upon confirmation of anesthesia, a freer elevator was utilized to free the medial and lateral nail border from the nail bed.  The nail border was then avulsed proximal to the eponychium and removed in toto.  The area was inspected for any remaining spicules.  A chemical matrixectomy was performed with phenol and irrigated with isopropyl alcohol solution.  Antibiotic ointment and a DSD were applied,  followed by a Coban dressing.  Patient tolerated the anesthetic and procedure well and will f/u in 2-3 weeks for recheck.  Patient given post-procedure instructions for daily 20-minute Epsom salt soaks, antibiotic ointment and daily use of Bandaids until toe starts to dry / form eschar.    Return in about 2 weeks (around 12/02/2024) for Nail Check.   Ethan Saddler, DPM, AACFAS [facility]     [address] [place], [place] [address]                Office [phone]  Fax [phone]       [1] No Known Allergies  "

## 2024-12-03 ENCOUNTER — Ambulatory Visit: Admitting: Podiatry

## 2024-12-10 ENCOUNTER — Ambulatory Visit (INDEPENDENT_AMBULATORY_CARE_PROVIDER_SITE_OTHER): Admitting: Otolaryngology

## 2024-12-10 ENCOUNTER — Ambulatory Visit (INDEPENDENT_AMBULATORY_CARE_PROVIDER_SITE_OTHER): Admitting: Audiology
